# Patient Record
Sex: Female | Born: 1965 | Race: White | Hispanic: No | Marital: Married | Smoking: Never smoker
Health system: Southern US, Community
[De-identification: ages and names within clinical notes are randomized; demographics above are authoritative.]

## PROBLEM LIST (undated history)

## (undated) DIAGNOSIS — B019 Varicella without complication: Secondary | ICD-10-CM

## (undated) DIAGNOSIS — Z973 Presence of spectacles and contact lenses: Secondary | ICD-10-CM

## (undated) DIAGNOSIS — F32A Depression, unspecified: Secondary | ICD-10-CM

## (undated) HISTORY — PX: COSMETIC SURGERY: SHX468

## (undated) HISTORY — DX: Varicella without complication: B01.9

## (undated) HISTORY — DX: Depression, unspecified: F32.A

## (undated) HISTORY — PX: TUBAL LIGATION: SHX77

---

## 2005-02-17 ENCOUNTER — Inpatient Hospital Stay: Payer: Self-pay | Admitting: Obstetrics & Gynecology

## 2007-10-05 ENCOUNTER — Ambulatory Visit: Payer: Self-pay | Admitting: Obstetrics & Gynecology

## 2009-07-16 ENCOUNTER — Ambulatory Visit: Payer: Self-pay | Admitting: Obstetrics & Gynecology

## 2011-02-25 ENCOUNTER — Ambulatory Visit: Payer: Self-pay | Admitting: Obstetrics & Gynecology

## 2013-11-05 ENCOUNTER — Ambulatory Visit: Payer: Self-pay | Admitting: Obstetrics & Gynecology

## 2013-11-19 ENCOUNTER — Ambulatory Visit: Payer: Self-pay | Admitting: Obstetrics & Gynecology

## 2014-11-13 ENCOUNTER — Other Ambulatory Visit: Payer: Self-pay | Admitting: Obstetrics & Gynecology

## 2014-11-13 DIAGNOSIS — R922 Inconclusive mammogram: Secondary | ICD-10-CM

## 2014-11-13 DIAGNOSIS — Z1231 Encounter for screening mammogram for malignant neoplasm of breast: Secondary | ICD-10-CM

## 2014-11-25 ENCOUNTER — Ambulatory Visit
Admission: RE | Admit: 2014-11-25 | Discharge: 2014-11-25 | Disposition: A | Discharge status: Home / Self Care | Payer: 59 | Source: Ambulatory Visit | Attending: Obstetrics & Gynecology | Admitting: Obstetrics & Gynecology

## 2014-11-25 DIAGNOSIS — Z1231 Encounter for screening mammogram for malignant neoplasm of breast: Secondary | ICD-10-CM | POA: Diagnosis present

## 2014-11-25 DIAGNOSIS — R922 Inconclusive mammogram: Secondary | ICD-10-CM

## 2015-11-14 ENCOUNTER — Ambulatory Visit (INDEPENDENT_AMBULATORY_CARE_PROVIDER_SITE_OTHER): Payer: 59 | Admitting: Family Medicine

## 2015-11-14 ENCOUNTER — Encounter: Payer: Self-pay | Admitting: Family Medicine

## 2015-11-14 VITALS — BP 106/84 | HR 77 | Temp 98.5°F | Resp 14 | Ht 63.5 in | Wt 125.4 lb

## 2015-11-14 DIAGNOSIS — Z1231 Encounter for screening mammogram for malignant neoplasm of breast: Secondary | ICD-10-CM

## 2015-11-14 DIAGNOSIS — Z0000 Encounter for general adult medical examination without abnormal findings: Secondary | ICD-10-CM | POA: Diagnosis not present

## 2015-11-14 DIAGNOSIS — Z23 Encounter for immunization: Secondary | ICD-10-CM

## 2015-11-14 DIAGNOSIS — Z1239 Encounter for other screening for malignant neoplasm of breast: Secondary | ICD-10-CM | POA: Insufficient documentation

## 2015-11-14 LAB — COMPREHENSIVE METABOLIC PANEL
ALT: 9 U/L (ref 0–35)
AST: 18 U/L (ref 0–37)
Albumin: 4.5 g/dL (ref 3.5–5.2)
Alkaline Phosphatase: 50 U/L (ref 39–117)
BILIRUBIN TOTAL: 0.4 mg/dL (ref 0.2–1.2)
BUN: 12 mg/dL (ref 6–23)
CALCIUM: 10.1 mg/dL (ref 8.4–10.5)
CO2: 30 meq/L (ref 19–32)
CREATININE: 0.93 mg/dL (ref 0.40–1.20)
Chloride: 105 mEq/L (ref 96–112)
GFR: 68.03 mL/min (ref >60.00)
Glucose, Bld: 78 mg/dL (ref 70–99)
Potassium: 5.4 mEq/L — ABNORMAL HIGH (ref 3.5–5.1)
Sodium: 143 mEq/L (ref 135–145)
TOTAL PROTEIN: 7.4 g/dL (ref 6.0–8.3)

## 2015-11-14 LAB — LIPID PANEL
CHOL/HDL RATIO: 2
CHOLESTEROL: 208 mg/dL — AB (ref 0–200)
HDL: 95.4 mg/dL (ref >39.00)
LDL CALC: 103 mg/dL — AB (ref 0–99)
NonHDL: 112.9
Triglycerides: 49 mg/dL (ref 0.0–149.0)
VLDL: 9.8 mg/dL (ref 0.0–40.0)

## 2015-11-14 LAB — CBC
HCT: 41.1 % (ref 36.0–46.0)
HEMOGLOBIN: 13.8 g/dL (ref 12.0–15.0)
MCHC: 33.5 g/dL (ref 30.0–36.0)
MCV: 91.3 fl (ref 78.0–100.0)
PLATELETS: 285 10*3/uL (ref 150.0–400.0)
RBC: 4.5 Mil/uL (ref 3.87–5.11)
RDW: 12.7 % (ref 11.5–15.5)
WBC: 11.4 10*3/uL — AB (ref 4.0–10.5)

## 2015-11-14 LAB — TSH: TSH: 2.27 u[IU]/mL (ref 0.35–4.50)

## 2015-11-14 NOTE — Patient Instructions (Signed)
Follow up annually.  Take care  Dr. Lacinda Axon   Health Maintenance, Female Adopting a healthy lifestyle and getting preventive care can go a long way to promote health and wellness. Talk with your health care provider about what schedule of regular examinations is right for you. This is a good chance for you to check in with your provider about disease prevention and staying healthy. In between checkups, there are plenty of things you can do on your own. Experts have done a lot of research about which lifestyle changes and preventive measures are most likely to keep you healthy. Ask your health care provider for more information. WEIGHT AND DIET  Eat a healthy diet  Be sure to include plenty of vegetables, fruits, low-fat dairy products, and lean protein.  Do not eat a lot of foods high in solid fats, added sugars, or salt.  Get regular exercise. This is one of the most important things you can do for your health.  Most adults should exercise for at least 150 minutes each week. The exercise should increase your heart rate and make you sweat (moderate-intensity exercise).  Most adults should also do strengthening exercises at least twice a week. This is in addition to the moderate-intensity exercise.  Maintain a healthy weight  Body mass index (BMI) is a measurement that can be used to identify possible weight problems. It estimates body fat based on height and weight. Your health care provider can help determine your BMI and help you achieve or maintain a healthy weight.  For females 106 years of age and older:   A BMI below 18.5 is considered underweight.  A BMI of 18.5 to 24.9 is normal.  A BMI of 25 to 29.9 is considered overweight.  A BMI of 30 and above is considered obese.  Watch levels of cholesterol and blood lipids  You should start having your blood tested for lipids and cholesterol at 49 years of age, then have this test every 5 years.  You may need to have your  cholesterol levels checked more often if:  Your lipid or cholesterol levels are high.  You are older than 49 years of age.  You are at high risk for heart disease.  CANCER SCREENING   Lung Cancer  Lung cancer screening is recommended for adults 21-4 years old who are at high risk for lung cancer because of a history of smoking.  A yearly low-dose CT scan of the lungs is recommended for people who:  Currently smoke.  Have quit within the past 15 years.  Have at least a 30-pack-year history of smoking. A pack year is smoking an average of one pack of cigarettes a day for 1 year.  Yearly screening should continue until it has been 15 years since you quit.  Yearly screening should stop if you develop a health problem that would prevent you from having lung cancer treatment.  Breast Cancer  Practice breast self-awareness. This means understanding how your breasts normally appear and feel.  It also means doing regular breast self-exams. Let your health care provider know about any changes, no matter how small.  If you are in your 20s or 30s, you should have a clinical breast exam (CBE) by a health care provider every 1-3 years as part of a regular health exam.  If you are 34 or older, have a CBE every year. Also consider having a breast X-ray (mammogram) every year.  If you have a family history of breast cancer, talk  to your health care provider about genetic screening.  If you are at high risk for breast cancer, talk to your health care provider about having an MRI and a mammogram every year.  Breast cancer gene (BRCA) assessment is recommended for women who have family members with BRCA-related cancers. BRCA-related cancers include:  Breast.  Ovarian.  Tubal.  Peritoneal cancers.  Results of the assessment will determine the need for genetic counseling and BRCA1 and BRCA2 testing. Cervical Cancer Your health care provider may recommend that you be screened regularly  for cancer of the pelvic organs (ovaries, uterus, and vagina). This screening involves a pelvic examination, including checking for microscopic changes to the surface of your cervix (Pap test). You may be encouraged to have this screening done every 3 years, beginning at age 61.  For women ages 66-65, health care providers may recommend pelvic exams and Pap testing every 3 years, or they may recommend the Pap and pelvic exam, combined with testing for human papilloma virus (HPV), every 5 years. Some types of HPV increase your risk of cervical cancer. Testing for HPV may also be done on women of any age with unclear Pap test results.  Other health care providers may not recommend any screening for nonpregnant women who are considered low risk for pelvic cancer and who do not have symptoms. Ask your health care provider if a screening pelvic exam is right for you.  If you have had past treatment for cervical cancer or a condition that could lead to cancer, you need Pap tests and screening for cancer for at least 20 years after your treatment. If Pap tests have been discontinued, your risk factors (such as having a new sexual partner) need to be reassessed to determine if screening should resume. Some women have medical problems that increase the chance of getting cervical cancer. In these cases, your health care provider may recommend more frequent screening and Pap tests. Colorectal Cancer  This type of cancer can be detected and often prevented.  Routine colorectal cancer screening usually begins at 49 years of age and continues through 49 years of age.  Your health care provider may recommend screening at an earlier age if you have risk factors for colon cancer.  Your health care provider may also recommend using home test kits to check for hidden blood in the stool.  A small camera at the end of a tube can be used to examine your colon directly (sigmoidoscopy or colonoscopy). This is done to check  for the earliest forms of colorectal cancer.  Routine screening usually begins at age 109.  Direct examination of the colon should be repeated every 5-10 years through 49 years of age. However, you may need to be screened more often if early forms of precancerous polyps or small growths are found. Skin Cancer  Check your skin from head to toe regularly.  Tell your health care provider about any new moles or changes in moles, especially if there is a change in a mole's shape or color.  Also tell your health care provider if you have a mole that is larger than the size of a pencil eraser.  Always use sunscreen. Apply sunscreen liberally and repeatedly throughout the day.  Protect yourself by wearing long sleeves, pants, a wide-brimmed hat, and sunglasses whenever you are outside. HEART DISEASE, DIABETES, AND HIGH BLOOD PRESSURE   High blood pressure causes heart disease and increases the risk of stroke. High blood pressure is more likely to develop  in:  People who have blood pressure in the high end of the normal range (130-139/85-89 mm Hg).  People who are overweight or obese.  People who are African American.  If you are 80-31 years of age, have your blood pressure checked every 3-5 years. If you are 29 years of age or older, have your blood pressure checked every year. You should have your blood pressure measured twice--once when you are at a hospital or clinic, and once when you are not at a hospital or clinic. Record the average of the two measurements. To check your blood pressure when you are not at a hospital or clinic, you can use:  An automated blood pressure machine at a pharmacy.  A home blood pressure monitor.  If you are between 81 years and 53 years old, ask your health care provider if you should take aspirin to prevent strokes.  Have regular diabetes screenings. This involves taking a blood sample to check your fasting blood sugar level.  If you are at a normal  weight and have a low risk for diabetes, have this test once every three years after 49 years of age.  If you are overweight and have a high risk for diabetes, consider being tested at a younger age or more often. PREVENTING INFECTION  Hepatitis B  If you have a higher risk for hepatitis B, you should be screened for this virus. You are considered at high risk for hepatitis B if:  You were born in a country where hepatitis B is common. Ask your health care provider which countries are considered high risk.  Your parents were born in a high-risk country, and you have not been immunized against hepatitis B (hepatitis B vaccine).  You have HIV or AIDS.  You use needles to inject street drugs.  You live with someone who has hepatitis B.  You have had sex with someone who has hepatitis B.  You get hemodialysis treatment.  You take certain medicines for conditions, including cancer, organ transplantation, and autoimmune conditions. Hepatitis C  Blood testing is recommended for:  Everyone born from [birth year] through 1964.  Anyone with known risk factors for hepatitis C. Sexually transmitted infections (STIs)  You should be screened for sexually transmitted infections (STIs) including gonorrhea and chlamydia if:  You are sexually active and are younger than 49 years of age.  You are older than 49 years of age and your health care provider tells you that you are at risk for this type of infection.  Your sexual activity has changed since you were last screened and you are at an increased risk for chlamydia or gonorrhea. Ask your health care provider if you are at risk.  If you do not have HIV, but are at risk, it may be recommended that you take a prescription medicine daily to prevent HIV infection. This is called pre-exposure prophylaxis (PrEP). You are considered at risk if:  You are sexually active and do not regularly use condoms or know the HIV status of your partner(s).  You take  drugs by injection.  You are sexually active with a partner who has HIV. Talk with your health care provider about whether you are at high risk of being infected with HIV. If you choose to begin PrEP, you should first be tested for HIV. You should then be tested every 3 months for as long as you are taking PrEP.  PREGNANCY   If you are premenopausal and you may become pregnant, ask  your health care provider about preconception counseling.  If you may become pregnant, take 400 to 800 micrograms (mcg) of folic acid every day.  If you want to prevent pregnancy, talk to your health care provider about birth control (contraception). OSTEOPOROSIS AND MENOPAUSE   Osteoporosis is a disease in which the bones lose minerals and strength with aging. This can result in serious bone fractures. Your risk for osteoporosis can be identified using a bone density scan.  If you are 35 years of age or older, or if you are at risk for osteoporosis and fractures, ask your health care provider if you should be screened.  Ask your health care provider whether you should take a calcium or vitamin D supplement to lower your risk for osteoporosis.  Menopause may have certain physical symptoms and risks.  Hormone replacement therapy may reduce some of these symptoms and risks. Talk to your health care provider about whether hormone replacement therapy is right for you.  HOME CARE INSTRUCTIONS   Schedule regular health, dental, and eye exams.  Stay current with your immunizations.   Do not use any tobacco products including cigarettes, chewing tobacco, or electronic cigarettes.  If you are pregnant, do not drink alcohol.  If you are breastfeeding, limit how much and how often you drink alcohol.  Limit alcohol intake to no more than 1 drink per day for nonpregnant women. One drink equals 12 ounces of beer, 5 ounces of wine, or 1 ounces of hard liquor.  Do not use street drugs.  Do not share  needles.  Ask your health care provider for help if you need support or information about quitting drugs.  Tell your health care provider if you often feel depressed.  Tell your health care provider if you have ever been abused or do not feel safe at home.   This information is not intended to replace advice given to you by your health care provider. Make sure you discuss any questions you have with your health care provider.   Document Released: 07/06/2010 Document Revised: 01/11/2014 Document Reviewed: 11/22/2012 Elsevier Interactive Patient Education [employer].

## 2015-11-14 NOTE — Progress Notes (Signed)
Subjective:  Patient ID: Carmen George, female    DOB: 12/20/1965  Age: 50 y.o. MRN: [id number]  CC: Establish care/physical  HPI Carmen LarsenLillian M Munns is a 50 y.o. female presents to the clinic today for an annual physical and to establish care.  Preventative Healthcare  Pap smear: Up to date.  Mammogram: In need of.  Colonoscopy: Not indicated.  Immunizations  Tetanus - Has been approx. 10 years.  Pneumococcal - Not indicated.  Flu - In need of.  Labs: Screening labs today.  Exercise: Exercises regularly.  Alcohol use: See below.  Smoking/tobacco use: Nonsmoker.  Regular dental exams: Yes.   Wears seat belt: Yes.  PMH, Surgical Hx, Family Hx, Social History reviewed and updated as below.  Past Medical History:  Diagnosis Date  . Chicken pox   . Chicken pox    Past Surgical History:  Procedure Laterality Date  . CESAREAN SECTION     x 3   Family History  Problem Relation Age of Onset  . Hyperlipidemia Mother   . Hypertension Mother   . Heart disease Father   . Hypertension Father   . Mental illness Father   . Diabetes Father   . Breast cancer Neg Hx    Social History  Substance Use Topics  . Smoking status: Never Smoker  . Smokeless tobacco: Not on file  . Alcohol use Yes     Comment: 3-4/week    Review of Systems  Cardiovascular: Positive for chest pain.  Psychiatric/Behavioral:       Stress.  All other systems reviewed and are negative.  Objective:   Today's Vitals: BP 106/84   Pulse 77   Temp 98.5 F (36.9 C)   Resp 14   Ht 5' 3.5" (1.613 m)   Wt 125 lb 6.4 oz (56.9 kg)   SpO2 98%   BMI 21.87 kg/m   Physical Exam  Constitutional: She is oriented to person, place, and time. She appears well-developed and well-nourished. No distress.  HENT:  Head: Normocephalic and atraumatic.  Nose: Nose normal.  Mouth/Throat: Oropharynx is clear and moist. No oropharyngeal exudate.  Normal TM's bilaterally.   Eyes: Conjunctivae are  normal. No scleral icterus.  Neck: Neck supple.  Cardiovascular: Normal rate and regular rhythm.   No murmur heard. Pulmonary/Chest: Effort normal and breath sounds normal. She has no wheezes. She has no rales.  Abdominal: Soft. She exhibits no distension. There is no tenderness. There is no rebound and no guarding.  Musculoskeletal: Normal range of motion. She exhibits no edema.  Lymphadenopathy:    She has no cervical adenopathy.  Neurological: She is alert and oriented to person, place, and time.  Skin: Skin is warm and dry. No rash noted.  Psychiatric: She has a normal mood and affect.  Vitals reviewed.  Assessment & Plan:   Problem List Items Addressed This Visit    Breast cancer screening   Relevant Orders   MM Digital Screening   Annual physical exam - Primary    Flu given today. Annual labs. Pap smear up to date. Mammogram ordered. Patient is a healthy weight. Blood pressure normal. Exercising regularly.      Relevant Orders   CBC   Comprehensive metabolic panel   Lipid panel   TSH    Other Visit Diagnoses    Encounter for immunization       Relevant Orders   Flu Vaccine QUAD 36+ mos IM (Completed)     Follow-up: Return in about 1  year (around 11/13/2016).  Everlene OtherJayce Bartlomiej Jenkinson DO [facility] [place]

## 2015-11-14 NOTE — Progress Notes (Signed)
Pre visit review using our clinic review tool, if applicable. No additional management support is needed unless otherwise documented below in the visit note. 

## 2015-11-14 NOTE — Assessment & Plan Note (Signed)
Flu given today. Annual labs. Pap smear up to date. Mammogram ordered. Patient is a healthy weight. Blood pressure normal. Exercising regularly.

## 2015-11-18 ENCOUNTER — Telehealth: Payer: Self-pay | Admitting: Family Medicine

## 2015-11-18 NOTE — Telephone Encounter (Signed)
Pt called returning your call in regards to lab results. Thank you!  Call pt @ [phone]

## 2015-11-18 NOTE — Telephone Encounter (Signed)
Pt advised of labs and gave verbal understanding.

## 2015-12-10 ENCOUNTER — Ambulatory Visit: Admission: RE | Admit: 2015-12-10 | Payer: 59 | Source: Ambulatory Visit

## 2015-12-12 ENCOUNTER — Ambulatory Visit
Admission: RE | Admit: 2015-12-12 | Discharge: 2015-12-12 | Disposition: A | Discharge status: Home / Self Care | Payer: 59 | Source: Ambulatory Visit | Attending: Family Medicine | Admitting: Family Medicine

## 2015-12-12 DIAGNOSIS — Z1231 Encounter for screening mammogram for malignant neoplasm of breast: Secondary | ICD-10-CM | POA: Diagnosis not present

## 2015-12-12 DIAGNOSIS — Z1239 Encounter for other screening for malignant neoplasm of breast: Secondary | ICD-10-CM

## 2016-08-18 ENCOUNTER — Ambulatory Visit: Payer: Self-pay | Admitting: Obstetrics & Gynecology

## 2016-09-23 ENCOUNTER — Encounter: Payer: Self-pay | Admitting: Obstetrics & Gynecology

## 2016-09-23 ENCOUNTER — Ambulatory Visit (INDEPENDENT_AMBULATORY_CARE_PROVIDER_SITE_OTHER): Payer: 59 | Admitting: Obstetrics & Gynecology

## 2016-09-23 VITALS — BP 120/80 | HR 69 | Ht 64.0 in | Wt 128.0 lb

## 2016-09-23 DIAGNOSIS — Z13220 Encounter for screening for lipoid disorders: Secondary | ICD-10-CM

## 2016-09-23 DIAGNOSIS — Z1211 Encounter for screening for malignant neoplasm of colon: Secondary | ICD-10-CM

## 2016-09-23 DIAGNOSIS — Z1329 Encounter for screening for other suspected endocrine disorder: Secondary | ICD-10-CM

## 2016-09-23 DIAGNOSIS — Z1239 Encounter for other screening for malignant neoplasm of breast: Secondary | ICD-10-CM

## 2016-09-23 DIAGNOSIS — Z1321 Encounter for screening for nutritional disorder: Secondary | ICD-10-CM | POA: Diagnosis not present

## 2016-09-23 DIAGNOSIS — Z0000 Encounter for general adult medical examination without abnormal findings: Secondary | ICD-10-CM

## 2016-09-23 DIAGNOSIS — Z1231 Encounter for screening mammogram for malignant neoplasm of breast: Secondary | ICD-10-CM | POA: Diagnosis not present

## 2016-09-23 DIAGNOSIS — Z124 Encounter for screening for malignant neoplasm of cervix: Secondary | ICD-10-CM | POA: Diagnosis not present

## 2016-09-23 NOTE — Patient Instructions (Signed)
PAP every three years Mammogram every year    Call [phone] to schedule at [facility] Colonoscopy every 10 years 

## 2016-09-23 NOTE — Progress Notes (Signed)
HPI:      [name] is a 51 y.o. [id number] who LMP was in the past, she presents today for her annual examination.  The patient has no complaints today. The patient is sexually active. Herlast pap: approximate date 2015 and was normal and last mammogram: approximate date 2016 and was normal.  The patient does perform self breast exams.  There is no notable family history of breast or ovarian cancer in her family. The patient is not taking hormone replacement therapy. Patient denies post-menopausal vaginal bleeding.   The patient has regular exercise: yes. The patient denies current symptoms of depression.  No period >1 year  GYN Hx: Last Colonoscopy:never ago.  Last DEXA: never ago.    PMHx: Past Medical History:  Diagnosis Date  . Chicken pox   . Chicken pox    Past Surgical History:  Procedure Laterality Date  . CESAREAN SECTION     x 3   Family History  Problem Relation Age of Onset  . Hyperlipidemia Mother   . Hypertension Mother   . Heart disease Father   . Hypertension Father   . Mental illness Father   . Diabetes Father   . Breast cancer Neg Hx    Social History  Substance Use Topics  . Smoking status: Never Smoker  . Smokeless tobacco: Never Used  . Alcohol use Yes     Comment: 3-4/week   No current outpatient prescriptions on file. Allergies: Patient has no known allergies.  Review of Systems  Constitutional: Negative for chills, fever and malaise/fatigue.  HENT: Negative for congestion, sinus pain and sore throat.   Eyes: Negative for blurred vision and pain.  Respiratory: Negative for cough and wheezing.   Cardiovascular: Negative for chest pain and leg swelling.  Gastrointestinal: Negative for abdominal pain, constipation, diarrhea, heartburn, nausea and vomiting.  Genitourinary: Negative for dysuria, frequency, hematuria and urgency.  Musculoskeletal: Negative for back pain, joint pain, myalgias and neck pain.  Skin: Negative for itching and  rash.  Neurological: Negative for dizziness, tremors and weakness.  Endo/Heme/Allergies: Does not bruise/bleed easily.  Psychiatric/Behavioral: Negative for depression. The patient is not nervous/anxious and does not have insomnia.     Objective: BP 120/80   Pulse 69   Ht  (1.626 m)   Wt 128 lb (58.1 kg)   LMP 06/06/2015   BMI 21.97 kg/m   Filed Weights   09/23/16 0946  Weight: 128 lb (58.1 kg)   Body mass index is 21.97 kg/m. Physical Exam  Constitutional: She is oriented to person, place, and time. She appears well-developed and well-nourished. No distress.  Genitourinary: Rectum normal, vagina normal and uterus normal. Pelvic exam was performed with patient supine. There is no rash or lesion on the right labia. There is no rash or lesion on the left labia. Vagina exhibits no lesion. No bleeding in the vagina. Right adnexum does not display mass and does not display tenderness. Left adnexum does not display mass and does not display tenderness. Cervix does not exhibit motion tenderness, lesion, friability or polyp.   Uterus is mobile and midaxial. Uterus is not enlarged or exhibiting a mass.  HENT:  Head: Normocephalic and atraumatic. Head is without laceration.  Right Ear: Hearing normal.  Left Ear: Hearing normal.  Nose: No epistaxis.  No foreign bodies.  Mouth/Throat: Uvula is midline, oropharynx is clear and moist and mucous membranes are normal.  Eyes: Pupils are equal, round, and reactive to light.  Neck: Normal range  of motion. Neck supple. No thyromegaly present.  Cardiovascular: Normal rate and regular rhythm.  Exam reveals no gallop and no friction rub.   No murmur heard. Pulmonary/Chest: Effort normal and breath sounds normal. No respiratory distress. She has no wheezes. Right breast exhibits no mass, no skin change and no tenderness. Left breast exhibits no mass, no skin change and no tenderness.  Abdominal: Soft. Bowel sounds are normal. She exhibits no  distension. There is no tenderness. There is no rebound.  Musculoskeletal: Normal range of motion.  Neurological: She is alert and oriented to person, place, and time. No cranial nerve deficit.  Skin: Skin is warm and dry.  Psychiatric: She has a normal mood and affect. Judgment normal.  Vitals reviewed.   Assessment: Annual Exam 1. Annual physical exam   2. Screening for breast cancer   3. Screening for cervical cancer   4. Screen for colon cancer   5. Encounter for vitamin deficiency screening   6. Screening for cholesterol level   7. Screening for thyroid disorder     Plan:            1.  Cervical Screening-  Pap smear done today  2. Breast screening- Exam annually and mammogram scheduled  3. Colonoscopy every 10 years, Hemoccult testing after age 58  4. Labs Ordered today  5. Counseling for hormonal therapy: none     F/U  Return in about 1 year (around 09/23/2017) for Annual.  Annamarie Major, MD, [facility], [facility] 09/23/2016  10:10 AM

## 2016-09-24 LAB — LIPID PANEL
CHOLESTEROL TOTAL: 208 mg/dL — AB (ref 100–199)
Chol/HDL Ratio: 2 ratio (ref 0.0–4.4)
HDL: 105 mg/dL (ref >39)
LDL CALC: 91 mg/dL (ref 0–99)
TRIGLYCERIDES: 59 mg/dL (ref 0–149)
VLDL CHOLESTEROL CAL: 12 mg/dL (ref 5–40)

## 2016-09-24 LAB — VITAMIN D 25 HYDROXY (VIT D DEFICIENCY, FRACTURES): VIT D 25 HYDROXY: 33.9 ng/mL (ref 30.0–100.0)

## 2016-09-24 LAB — TSH: TSH: 1.51 u[IU]/mL (ref 0.450–4.500)

## 2016-09-28 LAB — IGP, APTIMA HPV
HPV Aptima: NEGATIVE
PAP Smear Comment: 0

## 2016-12-17 ENCOUNTER — Encounter: Payer: Self-pay | Admitting: *Deleted

## 2016-12-20 ENCOUNTER — Ambulatory Visit
Admission: RE | Admit: 2016-12-20 | Discharge: 2016-12-20 | Disposition: A | Discharge status: Home / Self Care | Payer: 59 | Source: Ambulatory Visit | Attending: Obstetrics & Gynecology | Admitting: Obstetrics & Gynecology

## 2016-12-20 DIAGNOSIS — Z1239 Encounter for other screening for malignant neoplasm of breast: Secondary | ICD-10-CM

## 2016-12-20 DIAGNOSIS — Z1231 Encounter for screening mammogram for malignant neoplasm of breast: Secondary | ICD-10-CM | POA: Insufficient documentation

## 2016-12-21 ENCOUNTER — Other Ambulatory Visit: Payer: Self-pay | Admitting: Obstetrics & Gynecology

## 2016-12-21 DIAGNOSIS — R928 Other abnormal and inconclusive findings on diagnostic imaging of breast: Secondary | ICD-10-CM

## 2017-01-03 ENCOUNTER — Ambulatory Visit
Admission: RE | Admit: 2017-01-03 | Discharge: 2017-01-03 | Disposition: A | Discharge status: Home / Self Care | Payer: 59 | Source: Ambulatory Visit | Attending: Obstetrics & Gynecology | Admitting: Obstetrics & Gynecology

## 2017-01-03 ENCOUNTER — Encounter: Payer: Self-pay | Admitting: Radiology

## 2017-01-03 DIAGNOSIS — R590 Localized enlarged lymph nodes: Secondary | ICD-10-CM | POA: Diagnosis not present

## 2017-01-03 DIAGNOSIS — R928 Other abnormal and inconclusive findings on diagnostic imaging of breast: Secondary | ICD-10-CM | POA: Insufficient documentation

## 2018-01-20 ENCOUNTER — Ambulatory Visit: Payer: 59 | Admitting: Obstetrics & Gynecology

## 2018-02-03 ENCOUNTER — Encounter: Payer: Self-pay | Admitting: Obstetrics & Gynecology

## 2018-02-03 ENCOUNTER — Other Ambulatory Visit: Payer: Self-pay

## 2018-02-03 ENCOUNTER — Telehealth: Payer: Self-pay | Admitting: Gastroenterology

## 2018-02-03 ENCOUNTER — Ambulatory Visit (INDEPENDENT_AMBULATORY_CARE_PROVIDER_SITE_OTHER): Payer: 59 | Admitting: Obstetrics & Gynecology

## 2018-02-03 VITALS — BP 100/60 | Ht 64.0 in | Wt 129.0 lb

## 2018-02-03 DIAGNOSIS — Z01419 Encounter for gynecological examination (general) (routine) without abnormal findings: Secondary | ICD-10-CM | POA: Diagnosis not present

## 2018-02-03 DIAGNOSIS — Z1211 Encounter for screening for malignant neoplasm of colon: Secondary | ICD-10-CM

## 2018-02-03 DIAGNOSIS — Z1239 Encounter for other screening for malignant neoplasm of breast: Secondary | ICD-10-CM

## 2018-02-03 NOTE — Progress Notes (Signed)
HPI:      [name] is a 53 y.o. [id number] who LMP was in the past- she still has about one period a year; she presents today for her annual examination.  The patient has no complaints today.  Insomnia and hot flashes at times, more associated w stress.  The patient is sexually active. Herlast pap: approximate date 2018 and was normal and last mammogram: approximate date 2018 (dec) and was normal.  The patient does perform self breast exams.  There is no notable family history of breast or ovarian cancer in her family. The patient is not taking hormone replacement therapy. Patient denies post-menopausal vaginal bleeding.   The patient has regular exercise: yes. The patient denies current symptoms of depression.    GYN Hx: Last Colonoscopy: Has not done yet.  PMHx: Past Medical History:  Diagnosis Date  . Chicken pox   . Chicken pox    Past Surgical History:  Procedure Laterality Date  . CESAREAN SECTION     x 3   Family History  Problem Relation Age of Onset  . Hyperlipidemia Mother   . Hypertension Mother   . Heart disease Father   . Hypertension Father   . Mental illness Father   . Diabetes Father   . Breast cancer Neg Hx    Social History   Tobacco Use  . Smoking status: Never Smoker  . Smokeless tobacco: Never Used  Substance Use Topics  . Alcohol use: Yes    Comment: 3-4/week  . Drug use: No   No current outpatient medications on file. Allergies: Patient has no known allergies.  Review of Systems  Constitutional: Negative for chills, fever and malaise/fatigue.  HENT: Negative for congestion, sinus pain and sore throat.   Eyes: Negative for blurred vision and pain.  Respiratory: Negative for cough and wheezing.   Cardiovascular: Negative for chest pain and leg swelling.  Gastrointestinal: Negative for abdominal pain, constipation, diarrhea, heartburn, nausea and vomiting.  Genitourinary: Negative for dysuria, frequency, hematuria and urgency.    Musculoskeletal: Negative for back pain, joint pain, myalgias and neck pain.  Skin: Negative for itching and rash.  Neurological: Negative for dizziness, tremors and weakness.  Endo/Heme/Allergies: Does not bruise/bleed easily.  Psychiatric/Behavioral: Negative for depression. The patient is not nervous/anxious and does not have insomnia.     Objective: BP 100/60   Ht 5\' 4"  (1.626 m)   Wt 129 lb (58.5 kg)   BMI 22.14 kg/m   Filed Weights   02/03/18 0822  Weight: 129 lb (58.5 kg)   Body mass index is 22.14 kg/m. Physical Exam Constitutional:      General: She is not in acute distress.    Appearance: She is well-developed.  Genitourinary:     Pelvic exam was performed with patient supine.     Vagina, uterus and rectum normal.     No lesions in the vagina.     No vaginal bleeding.     No cervical motion tenderness, friability, lesion or polyp.     Uterus is mobile.     Uterus is not enlarged.     No uterine mass detected.    Uterus is midaxial.     No right or left adnexal mass present.     Right adnexa not tender.     Left adnexa not tender.  HENT:     Head: Normocephalic and atraumatic. No laceration.     Right Ear: Hearing normal.     Left Ear:  Hearing normal.     Mouth/Throat:     Pharynx: Uvula midline.  Eyes:     Pupils: Pupils are equal, round, and reactive to light.  Neck:     Musculoskeletal: Normal range of motion and neck supple.     Thyroid: No thyromegaly.  Cardiovascular:     Rate and Rhythm: Normal rate and regular rhythm.     Heart sounds: No murmur. No friction rub. No gallop.   Pulmonary:     Effort: Pulmonary effort is normal. No respiratory distress.     Breath sounds: Normal breath sounds. No wheezing.  Chest:     Breasts:        Right: No mass, skin change or tenderness.        Left: No mass, skin change or tenderness.  Abdominal:     General: Bowel sounds are normal. There is no distension.     Palpations: Abdomen is soft.      Tenderness: There is no abdominal tenderness. There is no rebound.  Musculoskeletal: Normal range of motion.  Neurological:     Mental Status: She is alert and oriented to person, place, and time.     Cranial Nerves: No cranial nerve deficit.  Skin:    General: Skin is warm and dry.  Psychiatric:        Judgment: Judgment normal.  Vitals signs reviewed.   Assessment: Annual Exam 1. Women's annual routine gynecological examination   2. Screening for breast cancer   3. Screen for colon cancer    Plan:            1.  Cervical Screening-  Pap smear schedule reviewed with patient  2. Breast screening- Exam annually and mammogram scheduled  3. Colonoscopy every 10 years, Hemoccult testing after age 73  4. Labs UTD; normal 2018  5. Counseling for hormonal therapy: none Monitor menopause sx's, no need for tx now  6. Stress, monitor effects.  Mother healthy, mother in law lives nearby and is having some health decline.  Has senior in [employer], planning college.  Work involves travel monthly, low stress there usually.    F/U  Return in about 1 year (around 02/04/2019) for Annual.  Annamarie Major, MD, [facility], [facility] 02/03/2018  8:57 AM

## 2018-02-03 NOTE — Patient Instructions (Signed)
PAP every three years Mammogram every year    Call [phone] to schedule at [place] Colonoscopy every 10 years Labs yearly (with PCP) 

## 2018-02-03 NOTE — Telephone Encounter (Signed)
Pt is calling for Marcelino Duster to finish setting up for a colonscopy

## 2018-02-03 NOTE — Telephone Encounter (Signed)
Patient returned call to complete scheduling her colonoscopy.  Scheduling has been completed.  She has been scheduled for 03/31/18 at [facility] with Dr. Servando Snare.  Thanks [employer]

## 2018-03-07 ENCOUNTER — Ambulatory Visit
Admission: RE | Admit: 2018-03-07 | Discharge: 2018-03-07 | Disposition: A | Discharge status: Home / Self Care | Payer: 59 | Source: Ambulatory Visit | Attending: Obstetrics & Gynecology | Admitting: Obstetrics & Gynecology

## 2018-03-07 DIAGNOSIS — Z1239 Encounter for other screening for malignant neoplasm of breast: Secondary | ICD-10-CM | POA: Diagnosis present

## 2018-03-07 DIAGNOSIS — Z1231 Encounter for screening mammogram for malignant neoplasm of breast: Secondary | ICD-10-CM | POA: Insufficient documentation

## 2018-03-27 ENCOUNTER — Telehealth: Payer: Self-pay

## 2018-03-27 NOTE — Telephone Encounter (Signed)
LVM informing patient her 03/31/18 colonoscopy has been canceled due to COVID19 and we will reschedule her once restrictions have been lifted.  Thanks [employer]

## 2018-03-31 ENCOUNTER — Ambulatory Visit: Admit: 2018-03-31 | Payer: 59 | Admitting: Gastroenterology

## 2018-03-31 SURGERY — COLONOSCOPY WITH PROPOFOL
Anesthesia: Choice

## 2018-04-11 ENCOUNTER — Telehealth: Payer: Self-pay

## 2018-04-11 NOTE — Telephone Encounter (Signed)
LVM for pt to call office in regards to rescheduling her screening colonoscopy at [facility] with Dr. Servando Snare.  Thanks [employer]

## 2018-11-21 ENCOUNTER — Telehealth: Payer: Self-pay | Admitting: Gastroenterology

## 2018-11-21 NOTE — Telephone Encounter (Signed)
Pt left vm to r/s her procedure °

## 2018-11-21 NOTE — Telephone Encounter (Signed)
LVM for pt to call office back to schedule her colonoscopy.  Thanks Presley Gora  

## 2019-01-31 ENCOUNTER — Other Ambulatory Visit: Payer: Self-pay | Admitting: Obstetrics & Gynecology

## 2019-01-31 DIAGNOSIS — Z1231 Encounter for screening mammogram for malignant neoplasm of breast: Secondary | ICD-10-CM

## 2019-03-08 ENCOUNTER — Ambulatory Visit
Admission: RE | Admit: 2019-03-08 | Discharge: 2019-03-08 | Disposition: A | Discharge status: Home / Self Care | Payer: 59 | Source: Ambulatory Visit | Attending: Obstetrics & Gynecology | Admitting: Obstetrics & Gynecology

## 2019-03-08 DIAGNOSIS — Z1231 Encounter for screening mammogram for malignant neoplasm of breast: Secondary | ICD-10-CM

## 2019-03-13 ENCOUNTER — Telehealth: Payer: Self-pay

## 2019-03-13 NOTE — Telephone Encounter (Signed)
Patient contacted the office in regards to scheduling her colonoscopy from March 2020.  Returned her phone call.  LVM for her to have her PCP or Dr. Tiburcio Pea to send a new referral because I'm unable to open the one from March 2020.  Thanks,  [place], [place]

## 2019-04-13 ENCOUNTER — Other Ambulatory Visit: Payer: Self-pay

## 2019-04-13 ENCOUNTER — Encounter: Payer: Self-pay | Admitting: Obstetrics & Gynecology

## 2019-04-13 ENCOUNTER — Other Ambulatory Visit (HOSPITAL_COMMUNITY)
Admission: RE | Admit: 2019-04-13 | Discharge: 2019-04-13 | Disposition: A | Discharge status: Home / Self Care | Payer: 59 | Source: Ambulatory Visit | Attending: Obstetrics & Gynecology | Admitting: Obstetrics & Gynecology

## 2019-04-13 ENCOUNTER — Ambulatory Visit (INDEPENDENT_AMBULATORY_CARE_PROVIDER_SITE_OTHER): Payer: 59 | Admitting: Obstetrics & Gynecology

## 2019-04-13 VITALS — BP 100/60 | Ht 64.0 in | Wt 130.0 lb

## 2019-04-13 DIAGNOSIS — Z124 Encounter for screening for malignant neoplasm of cervix: Secondary | ICD-10-CM

## 2019-04-13 DIAGNOSIS — Z01419 Encounter for gynecological examination (general) (routine) without abnormal findings: Secondary | ICD-10-CM | POA: Diagnosis not present

## 2019-04-13 DIAGNOSIS — Z1329 Encounter for screening for other suspected endocrine disorder: Secondary | ICD-10-CM

## 2019-04-13 DIAGNOSIS — Z1211 Encounter for screening for malignant neoplasm of colon: Secondary | ICD-10-CM

## 2019-04-13 DIAGNOSIS — Z13220 Encounter for screening for lipoid disorders: Secondary | ICD-10-CM

## 2019-04-13 DIAGNOSIS — Z131 Encounter for screening for diabetes mellitus: Secondary | ICD-10-CM

## 2019-04-13 DIAGNOSIS — Z1321 Encounter for screening for nutritional disorder: Secondary | ICD-10-CM

## 2019-04-13 NOTE — Addendum Note (Signed)
Addended by: Nadara Mustard on: 04/13/2019 08:49 AM   Modules accepted: Orders

## 2019-04-13 NOTE — Patient Instructions (Signed)
PAP every three years Mammogram every year    Call [phone] to schedule at [facility] Colonoscopy every 10 years Labs due

## 2019-04-13 NOTE — Progress Notes (Signed)
HPI:      [name] is a 54 y.o. [id number] who LMP was in the past, she presents today for her annual examination.  The patient has no complaints today. The patient is sexually active. Herlast pap: approximate date 2018 and was normal and last mammogram: approximate date 03/2019 and was normal.  The patient does perform self breast exams.  There is no notable family history of breast or ovarian cancer in her family. The patient is not taking hormone replacement therapy. Patient denies post-menopausal vaginal bleeding.   The patient has regular exercise: yes. The patient denies current symptoms of depression.  No bleeding for > 1 year, rare hot flash w stress.  GYN Hx: Last Colonoscopy: due last year, rescheduled due to Covid19, plans soon  PMHx: Past Medical History:  Diagnosis Date  . Chicken pox   . Chicken pox    Past Surgical History:  Procedure Laterality Date  . CESAREAN SECTION     x 3   Family History  Problem Relation Age of Onset  . Hyperlipidemia Mother   . Hypertension Mother   . Heart disease Father   . Hypertension Father   . Mental illness Father   . Diabetes Father   . Breast cancer Neg Hx    Social History   Tobacco Use  . Smoking status: Never Smoker  . Smokeless tobacco: Never Used  Substance Use Topics  . Alcohol use: Yes    Comment: 3-4/week  . Drug use: No   No current outpatient medications on file. Allergies: Patient has no known allergies.  Review of Systems  Constitutional: Negative for chills, fever and malaise/fatigue.  HENT: Negative for congestion, sinus pain and sore throat.   Eyes: Negative for blurred vision and pain.  Respiratory: Negative for cough and wheezing.   Cardiovascular: Negative for chest pain and leg swelling.  Gastrointestinal: Negative for abdominal pain, constipation, diarrhea, heartburn, nausea and vomiting.  Genitourinary: Negative for dysuria, frequency, hematuria and urgency.  Musculoskeletal: Negative for  back pain, joint pain, myalgias and neck pain.  Skin: Negative for itching and rash.  Neurological: Negative for dizziness, tremors and weakness.  Endo/Heme/Allergies: Does not bruise/bleed easily.  Psychiatric/Behavioral: Negative for depression. The patient is not nervous/anxious and does not have insomnia.     Objective: BP 100/60   Ht 5\' 4"  (1.626 m)   Wt 130 lb (59 kg)   BMI 22.31 kg/m   Filed Weights   04/13/19 0810  Weight: 130 lb (59 kg)   Body mass index is 22.31 kg/m. Physical Exam Constitutional:      General: She is not in acute distress.    Appearance: She is well-developed.  Genitourinary:     Pelvic exam was performed with patient supine.     Vagina, uterus and rectum normal.     No lesions in the vagina.     No vaginal bleeding.     No cervical motion tenderness, friability, lesion or polyp.     Uterus is mobile.     Uterus is not enlarged.     No uterine mass detected.    Uterus is midaxial.     No right or left adnexal mass present.     Right adnexa not tender.     Left adnexa not tender.  HENT:     Head: Normocephalic and atraumatic. No laceration.     Right Ear: Hearing normal.     Left Ear: Hearing normal.     Mouth/Throat:  Pharynx: Uvula midline.  Eyes:     Pupils: Pupils are equal, round, and reactive to light.  Neck:     Thyroid: No thyromegaly.  Cardiovascular:     Rate and Rhythm: Normal rate and regular rhythm.     Heart sounds: No murmur. No friction rub. No gallop.   Pulmonary:     Effort: Pulmonary effort is normal. No respiratory distress.     Breath sounds: Normal breath sounds. No wheezing.  Chest:     Breasts:        Right: No mass, skin change or tenderness.        Left: No mass, skin change or tenderness.  Abdominal:     General: Bowel sounds are normal. There is no distension.     Palpations: Abdomen is soft.     Tenderness: There is no abdominal tenderness. There is no rebound.  Musculoskeletal:        General:  Normal range of motion.     Cervical back: Normal range of motion and neck supple.  Neurological:     Mental Status: She is alert and oriented to person, place, and time.     Cranial Nerves: No cranial nerve deficit.  Skin:    General: Skin is warm and dry.  Psychiatric:        Judgment: Judgment normal.  Vitals reviewed.     Assessment: Annual Exam 1. Women's annual routine gynecological examination   2. Screen for colon cancer   3. Screening for cervical cancer     Plan:            1.  Cervical Screening-  Pap smear done today  2. Breast screening- Exam annually and mammogram scheduled  3. Colonoscopy every 10 years, Hemoccult testing after age 70  4. Labs Ordered today  5. Counseling for hormonal therapy: none              6. FRAX - FRAX score for assessing the 10 year probability for fracture calculated and discussed today.  Based on age and score today, DEXA is not currently scheduled.    F/U  Return in about 1 year (around 04/12/2020) for Annual.  Annamarie Major, MD, [facility], [facility] 04/13/2019  8:13 AM

## 2019-04-14 LAB — LIPID PANEL
Chol/HDL Ratio: 2.3 ratio (ref 0.0–4.4)
Cholesterol, Total: 208 mg/dL — ABNORMAL HIGH (ref 100–199)
HDL: 90 mg/dL (ref >39)
LDL Chol Calc (NIH): 108 mg/dL — ABNORMAL HIGH (ref 0–99)
Triglycerides: 54 mg/dL (ref 0–149)
VLDL Cholesterol Cal: 10 mg/dL (ref 5–40)

## 2019-04-14 LAB — VITAMIN D 25 HYDROXY (VIT D DEFICIENCY, FRACTURES): Vit D, 25-Hydroxy: 33.2 ng/mL (ref 30.0–100.0)

## 2019-04-14 LAB — GLUCOSE, FASTING: Glucose, Plasma: 83 mg/dL (ref 65–99)

## 2019-04-14 LAB — TSH: TSH: 2.21 u[IU]/mL (ref 0.450–4.500)

## 2019-04-16 LAB — CYTOLOGY - PAP
Comment: NEGATIVE
Diagnosis: NEGATIVE
High risk HPV: NEGATIVE

## 2019-04-20 ENCOUNTER — Ambulatory Visit (INDEPENDENT_AMBULATORY_CARE_PROVIDER_SITE_OTHER): Payer: Self-pay | Admitting: Gastroenterology

## 2019-04-20 ENCOUNTER — Other Ambulatory Visit: Payer: Self-pay

## 2019-04-20 DIAGNOSIS — Z1211 Encounter for screening for malignant neoplasm of colon: Secondary | ICD-10-CM

## 2019-04-20 NOTE — Progress Notes (Signed)
Gastroenterology Pre-Procedure Review  Request Date: 06/08/19 Requesting Physician: Dr. Servando Snare  PATIENT REVIEW QUESTIONS: The patient responded to the following health history questions as indicated:    1. Are you having any GI issues? no 2. Do you have a personal history of Polyps? no 3. Do you have a family history of Colon Cancer or Polyps? yes (brother colon polyps) 4. Diabetes Mellitus? no 5. Joint replacements in the past 12 months?no 6. Major health problems in the past 3 months?no 7. Any artificial heart valves, MVP, or defibrillator?no    MEDICATIONS & ALLERGIES:    Patient reports the following regarding taking any anticoagulation/antiplatelet therapy:   Plavix, Coumadin, Eliquis, Xarelto, Lovenox, Pradaxa, Brilinta, or Effient? no Aspirin? no  Patient confirms/reports the following medications:  No current outpatient medications on file.   No current facility-administered medications for this visit.    Patient confirms/reports the following allergies:  No Known Allergies  No orders of the defined types were placed in this encounter.   AUTHORIZATION INFORMATION Primary Insurance: 1D#: Group #:  Secondary Insurance: 1D#: Group #:  SCHEDULE INFORMATION: Date: 06/04/21Time: Location:[facility]

## 2019-06-05 ENCOUNTER — Telehealth: Payer: Self-pay

## 2019-06-05 NOTE — Telephone Encounter (Signed)
Returned patients call to reschedule her 06/08/19 Colonoscopy with Dr. Servando Snare at [facility].  Unable to LVM on cell number due to voice mail being full.  I was able to LVM on home number.    Thanks,  [place], [place]

## 2019-06-06 ENCOUNTER — Telehealth: Payer: Self-pay

## 2019-06-06 ENCOUNTER — Other Ambulatory Visit: Admission: RE | Admit: 2019-06-06 | Payer: 59 | Source: Ambulatory Visit

## 2019-06-06 NOTE — Discharge Instructions (Signed)
General Anesthesia, Adult, Care After This sheet gives you information about how to care for yourself after your procedure. Your health care provider may also give you more specific instructions. If you have problems or questions, contact your health care provider. What can I expect after the procedure? After the procedure, the following side effects are common:  Pain or discomfort at the IV site.  Nausea.  Vomiting.  Sore throat.  Trouble concentrating.  Feeling cold or chills.  Weak or tired.  Sleepiness and fatigue.  Soreness and body aches. These side effects can affect parts of the body that were not involved in surgery. Follow these instructions at home:  For at least 24 hours after the procedure:  Have a responsible adult stay with you. It is important to have someone help care for you until you are awake and alert.  Rest as needed.  Do not: ? Participate in activities in which you could fall or become injured. ? Drive. ? Use heavy machinery. ? Drink alcohol. ? Take sleeping pills or medicines that cause drowsiness. ? Make important decisions or sign legal documents. ? Take care of children on your own. Eating and drinking  Follow any instructions from your health care provider about eating or drinking restrictions.  When you feel hungry, start by eating small amounts of foods that are soft and easy to digest (bland), such as toast. Gradually return to your regular diet.  Drink enough fluid to keep your urine pale yellow.  If you vomit, rehydrate by drinking water, juice, or clear broth. General instructions  If you have sleep apnea, surgery and certain medicines can increase your risk for breathing problems. Follow instructions from your health care provider about wearing your sleep device: ? Anytime you are sleeping, including during daytime naps. ? While taking prescription pain medicines, sleeping medicines, or medicines that make you drowsy.  Return to  your normal activities as told by your health care provider. Ask your health care provider what activities are safe for you.  Take over-the-counter and prescription medicines only as told by your health care provider.  If you smoke, do not smoke without supervision.  Keep all follow-up visits as told by your health care provider. This is important. Contact a health care provider if:  You have nausea or vomiting that does not get better with medicine.  You cannot eat or drink without vomiting.  You have pain that does not get better with medicine.  You are unable to pass urine.  You develop a skin rash.  You have a fever.  You have redness around your IV site that gets worse. Get help right away if:  You have difficulty breathing.  You have chest pain.  You have blood in your urine or stool, or you vomit blood. Summary  After the procedure, it is common to have a sore throat or nausea. It is also common to feel tired.  Have a responsible adult stay with you for the first 24 hours after general anesthesia. It is important to have someone help care for you until you are awake and alert.  When you feel hungry, start by eating small amounts of foods that are soft and easy to digest (bland), such as toast. Gradually return to your regular diet.  Drink enough fluid to keep your urine pale yellow.  Return to your normal activities as told by your health care provider. Ask your health care provider what activities are safe for you. This information is not   intended to replace advice given to you by your health care provider. Make sure you discuss any questions you have with your health care provider. Document Revised: 12/24/2016 Document Reviewed: 08/06/2016 Elsevier Patient Education  2020 Elsevier Inc.  

## 2019-06-06 NOTE — Telephone Encounter (Signed)
Colonoscopy has been rescheduled to 07/23/19 per patient request.  Referral updated.  New instructions sent via mychart. Pt advised of new COVID Test date Thursday 07/19/19.  Referral updated.  Thanks,  [place], [place]

## 2019-07-23 ENCOUNTER — Telehealth: Payer: Self-pay

## 2019-07-23 DIAGNOSIS — Z1211 Encounter for screening for malignant neoplasm of colon: Secondary | ICD-10-CM

## 2019-07-23 NOTE — Telephone Encounter (Signed)
Returned patients call to answer questions regarding her colonoscopy.  Patient was advised of her COVID testing date, however she will not be able to have it done due to work trip.    Colonoscopy has been rescheduled to 08/16/19 with Dr. Servando Snare from 08/02.  LVM with [facility] to notify of date change.  Instructions will be sent to patient via mychart.  Referral updated.  Thanks,  [place], [place]

## 2019-08-08 ENCOUNTER — Encounter: Payer: Self-pay | Admitting: Gastroenterology

## 2019-08-08 ENCOUNTER — Other Ambulatory Visit: Payer: Self-pay

## 2019-08-14 ENCOUNTER — Other Ambulatory Visit
Admission: RE | Admit: 2019-08-14 | Discharge: 2019-08-14 | Disposition: A | Discharge status: Home / Self Care | Payer: 59 | Source: Ambulatory Visit | Attending: Gastroenterology | Admitting: Gastroenterology

## 2019-08-14 ENCOUNTER — Other Ambulatory Visit: Payer: Self-pay

## 2019-08-14 DIAGNOSIS — Z01812 Encounter for preprocedural laboratory examination: Secondary | ICD-10-CM | POA: Diagnosis present

## 2019-08-14 DIAGNOSIS — Z20822 Contact with and (suspected) exposure to covid-19: Secondary | ICD-10-CM | POA: Diagnosis not present

## 2019-08-14 LAB — SARS CORONAVIRUS 2 (TAT 6-24 HRS): SARS Coronavirus 2: NEGATIVE

## 2019-08-16 ENCOUNTER — Encounter
Admission: RE | Disposition: A | Discharge status: Home / Self Care | Payer: Self-pay | Source: Home / Self Care | Attending: Gastroenterology

## 2019-08-16 ENCOUNTER — Ambulatory Visit
Admission: RE | Admit: 2019-08-16 | Discharge: 2019-08-16 | Disposition: A | Discharge status: Home / Self Care | Payer: 59 | Attending: Gastroenterology | Admitting: Gastroenterology

## 2019-08-16 ENCOUNTER — Other Ambulatory Visit: Payer: Self-pay

## 2019-08-16 ENCOUNTER — Ambulatory Visit: Payer: 59 | Admitting: Anesthesiology

## 2019-08-16 ENCOUNTER — Encounter: Payer: Self-pay | Admitting: Gastroenterology

## 2019-08-16 DIAGNOSIS — Z9851 Tubal ligation status: Secondary | ICD-10-CM | POA: Diagnosis not present

## 2019-08-16 DIAGNOSIS — Z1211 Encounter for screening for malignant neoplasm of colon: Secondary | ICD-10-CM

## 2019-08-16 HISTORY — DX: Presence of spectacles and contact lenses: Z97.3

## 2019-08-16 HISTORY — PX: COLONOSCOPY WITH PROPOFOL: SHX5780

## 2019-08-16 SURGERY — COLONOSCOPY WITH PROPOFOL
Anesthesia: General | Site: Rectum

## 2019-08-16 MED ORDER — STERILE WATER FOR IRRIGATION IR SOLN
Status: DC | PRN
  Administered 2019-08-16: .05 mL

## 2019-08-16 MED ORDER — ONDANSETRON HCL 4 MG/2ML IJ SOLN: 4.0000 mg | Freq: Once | INTRAMUSCULAR | Status: DC | PRN

## 2019-08-16 MED ORDER — LIDOCAINE HCL (CARDIAC) PF 100 MG/5ML IV SOSY
PREFILLED_SYRINGE | INTRAVENOUS | Status: DC | PRN
  Administered 2019-08-16: 30 mg via INTRAVENOUS

## 2019-08-16 MED ORDER — ACETAMINOPHEN 325 MG PO TABS: 325.0000 mg | ORAL_TABLET | ORAL | Status: DC | PRN

## 2019-08-16 MED ORDER — PROPOFOL 10 MG/ML IV BOLUS
INTRAVENOUS | Status: DC | PRN
  Administered 2019-08-16: 120 mg via INTRAVENOUS
  Administered 2019-08-16: 40 mg via INTRAVENOUS
  Administered 2019-08-16 (×2): 30 mg via INTRAVENOUS
  Administered 2019-08-16: 40 mg via INTRAVENOUS
  Administered 2019-08-16 (×2): 30 mg via INTRAVENOUS
  Administered 2019-08-16: 40 mg via INTRAVENOUS

## 2019-08-16 MED ORDER — SODIUM CHLORIDE 0.9 % IV SOLN: INTRAVENOUS | Status: DC

## 2019-08-16 MED ORDER — ACETAMINOPHEN 160 MG/5ML PO SOLN: 325.0000 mg | ORAL | Status: DC | PRN

## 2019-08-16 MED ORDER — LACTATED RINGERS IV SOLN: INTRAVENOUS | Status: DC

## 2019-08-16 SURGICAL SUPPLY — 8 items
FORCEPS BIOP RAD 4 LRG CAP 4 (CUTTING FORCEPS) IMPLANT
GOWN CVR UNV OPN BCK APRN NK (MISCELLANEOUS) ×2 IMPLANT
GOWN ISOL THUMB LOOP REG UNIV (MISCELLANEOUS) ×6
KIT ENDO PROCEDURE OLY (KITS) ×3 IMPLANT
MANIFOLD NEPTUNE II (INSTRUMENTS) ×3 IMPLANT
SNARE SHORT THROW 13M SML OVAL (MISCELLANEOUS) IMPLANT
TRAP ETRAP POLY (MISCELLANEOUS) IMPLANT
WATER STERILE IRR 250ML POUR (IV SOLUTION) ×3 IMPLANT

## 2019-08-16 NOTE — H&P (Signed)
Midge Minium, MD [facility] [address], [address] [place], [place] [address] Phone: [phone] Fax : [phone]  Primary Care Physician:  Tommie Sams, DO Primary Gastroenterologist:  Dr. Servando Snare  Pre-Procedure History & Physical: HPI:  Carmen George is a 54 y.o. female is here for a screening colonoscopy.   Past Medical History:  Diagnosis Date  . Chicken pox   . Wears contact lenses     Past Surgical History:  Procedure Laterality Date  . CESAREAN SECTION     x 3  . TUBAL LIGATION      Prior to Admission medications   Medication Sig Start Date End Date Taking? Authorizing Provider  Calcium-Vitamin D-Vitamin K (VIACTIV PO) Take by mouth daily.   Yes [provider]  Coenzyme Q10 (COQ10 PO) Take by mouth daily.   Yes [provider]  Multiple Vitamin (MULTIVITAMIN) capsule Take 1 capsule by mouth daily.   Yes [provider]  Omega-3 Fatty Acids (OMEGA 3 PO) Take by mouth.   Yes [provider]  Probiotic Product (PROBIOTIC PO) Take by mouth 3 (three) times daily.   Yes [provider]  TURMERIC PO Take by mouth daily.   Yes [provider]    Allergies as of 04/20/2019  . (No Known Allergies)    Family History  Problem Relation Age of Onset  . Hyperlipidemia Mother   . Hypertension Mother   . Heart disease Father   . Hypertension Father   . Mental illness Father   . Diabetes Father   . Breast cancer Neg Hx     Social History   Socioeconomic History  . Marital status: Married    Spouse name: Not on file  . Number of children: Not on file  . Years of education: Not on file  . Highest education level: Not on file  Occupational History  . Not on file  Tobacco Use  . Smoking status: Never Smoker  . Smokeless tobacco: Never Used  Vaping Use  . Vaping Use: Never used  Substance and Sexual Activity  . Alcohol use: Yes    Alcohol/week: 3.0 standard drinks    Types: 3 Standard drinks or equivalent per  week    Comment: 3-4/week  . Drug use: No  . Sexual activity: Yes    Partners: Male  Other Topics Concern  . Not on file  Social History Narrative  . Not on file   Social Determinants of Health   Financial Resource Strain:   . Difficulty of Paying Living Expenses:   Food Insecurity:   . Worried About Programme researcher, broadcasting/film/video in the Last Year:   . Barista in the Last Year:   Transportation Needs:   . Freight forwarder (Medical):   Marland Kitchen Lack of Transportation (Non-Medical):   Physical Activity:   . Days of Exercise per Week:   . Minutes of Exercise per Session:   Stress:   . Feeling of Stress :   Social Connections:   . Frequency of Communication with Friends and Family:   . Frequency of Social Gatherings with Friends and Family:   . Attends Religious Services:   . Active Member of Clubs or Organizations:   . Attends Banker Meetings:   Marland Kitchen Marital Status:   Intimate Partner Violence:   . Fear of Current or Ex-Partner:   . Emotionally Abused:   Marland Kitchen Physically Abused:   . Sexually Abused:     Review of  Systems: See HPI, otherwise negative ROS  Physical Exam: BP 109/60   Pulse (!) 58   Temp (!) 97.3 F (36.3 C) (Temporal)   Resp 16   Ht 5\' 4"  (1.626 m)   Wt 58.1 kg   LMP 06/05/2018 (Approximate)   SpO2 100%   BMI 21.97 kg/m  General:   Alert,  pleasant and cooperative in NAD Head:  Normocephalic and atraumatic. Neck:  Supple; no masses or thyromegaly. Lungs:  Clear throughout to auscultation.    Heart:  Regular rate and rhythm. Abdomen:  Soft, nontender and nondistended. Normal bowel sounds, without guarding, and without rebound.   Neurologic:  Alert and  oriented x4;  grossly normal neurologically.  Impression/Plan: Carmen George is now here to undergo a screening colonoscopy.  Risks, benefits, and alternatives regarding colonoscopy have been reviewed with the patient.  Questions have been answered.  All parties agreeable.

## 2019-08-16 NOTE — Anesthesia Preprocedure Evaluation (Signed)
Anesthesia Evaluation  Patient identified by MRN, date of birth, ID band Patient awake    Reviewed: Allergy & Precautions, NPO status , Patient's Chart, lab work & pertinent test results  Airway Mallampati: I  TM Distance: >3 FB Neck ROM: Full    Dental no notable dental hx.    Pulmonary neg pulmonary ROS,    Pulmonary exam normal breath sounds clear to auscultation       Cardiovascular Exercise Tolerance: Good negative cardio ROS Normal cardiovascular exam Rhythm:Regular Rate:Normal     Neuro/Psych negative neurological ROS  negative psych ROS   GI/Hepatic negative GI ROS, Neg liver ROS,   Endo/Other  negative endocrine ROS  Renal/GU negative Renal ROS  negative genitourinary   Musculoskeletal negative musculoskeletal ROS (+)   Abdominal   Peds negative pediatric ROS (+)  Hematology negative hematology ROS (+)   Anesthesia Other Findings   Reproductive/Obstetrics negative OB ROS                             Anesthesia Physical Anesthesia Plan  ASA: I  Anesthesia Plan: General   Post-op Pain Management:    Induction: Intravenous  PONV Risk Score and Plan: 2 and Treatment may vary due to age or medical condition and Propofol infusion  Airway Management Planned: Simple Face Mask  Additional Equipment:   Intra-op Plan:   Post-operative Plan:   Informed Consent: I have reviewed the patients History and Physical, chart, labs and discussed the procedure including the risks, benefits and alternatives for the proposed anesthesia with the patient or authorized representative who has indicated his/her understanding and acceptance.     Dental advisory given  Plan Discussed with: CRNA and Anesthesiologist  Anesthesia Plan Comments:         Anesthesia Quick Evaluation  Patient Active Problem List   Diagnosis Date Noted  . Annual physical exam 11/14/2015  . Breast cancer  screening 11/14/2015    CBC Latest Ref Rng & Units 11/14/2015  WBC 4.0 - 10.5 K/uL 11.4(H)  Hemoglobin 12.0 - 15.0 g/dL 57.3  Hematocrit 36 - 46 % 41.1  Platelets 150 - 400 K/uL 285.0   BMP Latest Ref Rng & Units 11/14/2015  Glucose 70 - 99 mg/dL 78  BUN 6 - 23 mg/dL 12  Creatinine 2.20 - 2.54 mg/dL 2.70  Sodium 623 - 762 mEq/L 143  Potassium 3.5 - 5.1 mEq/L 5.4(H)  Chloride 96 - 112 mEq/L 105  CO2 19 - 32 mEq/L 30  Calcium 8.4 - 10.5 mg/dL 83.1    Risks and benefits of anesthesia discussed at length, patient or surrogate demonstrates understanding. Appropriately NPO. Plan to proceed with anesthesia.  Corlis Leak, MD 08/16/19

## 2019-08-16 NOTE — Anesthesia Postprocedure Evaluation (Signed)
Anesthesia Post Note  Patient: Carmen George  Procedure(s) Performed: COLONOSCOPY WITH PROPOFOL (N/A Rectum)     Patient location during evaluation: Phase II Anesthesia Type: General Level of consciousness: awake and alert and oriented Pain management: pain level controlled Vital Signs Assessment: post-procedure vital signs reviewed and stable Respiratory status: respiratory function stable, nonlabored ventilation and spontaneous breathing Cardiovascular status: blood pressure returned to baseline and stable Postop Assessment: no backache and no headache Anesthetic complications: no   No complications documented.  Edwyna Ready

## 2019-08-16 NOTE — Op Note (Signed)
[facility] Gastroenterology Patient Name: Carmen George Procedure Date: 08/16/2019 9:07 AM MRN: [id number] Account #: [id number] Date of Birth: 02/26/1965 Admit Type: Outpatient Age: 54 Room: [facility] OR ROOM 01 Gender: Female Note Status: Finalized Procedure:             Colonoscopy Indications:           Screening for colorectal malignant neoplasm Providers:             Midge Minium MD, MD Referring MD:          Verdis Frederickson. Adriana Simas MD, MD (Referring MD) Medicines:             Propofol per Anesthesia Complications:         No immediate complications. Procedure:             Pre-Anesthesia Assessment:                        - Prior to the procedure, a History and Physical was                         performed, and patient medications and allergies were                         reviewed. The patient's tolerance of previous                         anesthesia was also reviewed. The risks and benefits                         of the procedure and the sedation options and risks                         were discussed with the patient. All questions were                         answered, and informed consent was obtained. Prior                         Anticoagulants: The patient has taken no previous                         anticoagulant or antiplatelet agents. ASA Grade                         Assessment: II - A patient with mild systemic disease.                         After reviewing the risks and benefits, the patient                         was deemed in satisfactory condition to undergo the                         procedure.                        After obtaining informed consent, the colonoscope was  passed under direct vision. Throughout the procedure,                         the patient's blood pressure, pulse, and oxygen                         saturations were monitored continuously. The                         Colonoscope was introduced through  the anus and                         advanced to the the cecum, identified by appendiceal                         orifice and ileocecal valve. The colonoscopy was                         performed without difficulty. The patient tolerated                         the procedure well. The quality of the bowel                         preparation was excellent. Findings:      The perianal and digital rectal examinations were normal.      The colon (entire examined portion) appeared normal. Impression:            - The entire examined colon is normal.                        - No specimens collected. Recommendation:        - Discharge patient to home.                        - Resume previous diet.                        - Continue present medications.                        - Repeat colonoscopy in 10 years for screening unless                         any change in family history or lower GI problems. Procedure Code(s):     --- Professional ---                        [phone], Colonoscopy, flexible; diagnostic, including                         collection of specimen(s) by brushing or washing, when                         performed (separate procedure) Diagnosis Code(s):     --- Professional ---                        Z12.11, Encounter for screening for malignant neoplasm  of colon CPT copyright 2019 [employer]. All rights reserved. The codes documented in this report are preliminary and upon coder review may  be revised to meet current compliance requirements. Midge Minium MD, MD 08/16/2019 9:33:57 AM This report has been signed electronically. Number of Addenda: 0 Note Initiated On: 08/16/2019 9:07 AM Scope Withdrawal Time: 0 hours 6 minutes 30 seconds  Total Procedure Duration: 0 hours 13 minutes 56 seconds  Estimated Blood Loss:  Estimated blood loss: none.      [facility]

## 2019-08-16 NOTE — Anesthesia Procedure Notes (Signed)
Date/Time: 08/16/2019 9:14 AM Performed by: Maree Krabbe, CRNA Pre-anesthesia Checklist: Patient identified, Emergency Drugs available, Suction available, Timeout performed and Patient being monitored Patient Re-evaluated:Patient Re-evaluated prior to induction Oxygen Delivery Method: Nasal cannula Placement Confirmation: positive ETCO2

## 2019-08-16 NOTE — Transfer of Care (Signed)
Immediate Anesthesia Transfer of Care Note  Patient: Carmen George  Procedure(s) Performed: COLONOSCOPY WITH PROPOFOL (N/A Rectum)  Patient Location: PACU  Anesthesia Type: General  Level of Consciousness: awake, alert  and patient cooperative  Airway and Oxygen Therapy: Patient Spontanous Breathing and Patient connected to supplemental oxygen  Post-op Assessment: Post-op Vital signs reviewed, Patient's Cardiovascular Status Stable, Respiratory Function Stable, Patent Airway and No signs of Nausea or vomiting  Post-op Vital Signs: Reviewed and stable  Complications: No complications documented.

## 2019-08-20 ENCOUNTER — Encounter: Payer: Self-pay | Admitting: Gastroenterology

## 2020-02-27 ENCOUNTER — Other Ambulatory Visit: Payer: Self-pay | Admitting: Obstetrics & Gynecology

## 2020-02-27 DIAGNOSIS — Z1231 Encounter for screening mammogram for malignant neoplasm of breast: Secondary | ICD-10-CM

## 2020-03-13 ENCOUNTER — Other Ambulatory Visit: Payer: Self-pay

## 2020-03-13 ENCOUNTER — Ambulatory Visit
Admission: RE | Admit: 2020-03-13 | Discharge: 2020-03-13 | Disposition: A | Discharge status: Home / Self Care | Payer: 59 | Source: Ambulatory Visit | Attending: Obstetrics & Gynecology | Admitting: Obstetrics & Gynecology

## 2020-03-13 DIAGNOSIS — Z1231 Encounter for screening mammogram for malignant neoplasm of breast: Secondary | ICD-10-CM | POA: Diagnosis present

## 2020-08-15 ENCOUNTER — Encounter: Payer: Self-pay | Admitting: Obstetrics & Gynecology

## 2020-08-15 ENCOUNTER — Other Ambulatory Visit: Payer: Self-pay

## 2020-08-15 ENCOUNTER — Ambulatory Visit (INDEPENDENT_AMBULATORY_CARE_PROVIDER_SITE_OTHER): Payer: 59 | Admitting: Obstetrics & Gynecology

## 2020-08-15 VITALS — BP 98/60 | Ht 64.0 in | Wt 116.0 lb

## 2020-08-15 DIAGNOSIS — Z01419 Encounter for gynecological examination (general) (routine) without abnormal findings: Secondary | ICD-10-CM | POA: Diagnosis not present

## 2020-08-15 NOTE — Patient Instructions (Signed)
Recommendations to boost your immunity to prevent illness such as viral flu and colds, including covid19, are as follows:       - - -  Vitamin K2 and Vitamin D3  - - - Take Vitamin K2 at 200-300 mcg daily (usually 2-3 pills daily of the over the counter formulation). Take Vitamin D3 at 3000-4000 U daily (usually 3-4 pills daily of the over the counter formulation). Studies show that these two at high normal levels in your system are very effective in keeping your immunity so strong and protective that you will be unlikely to contract viral illness such as those listed above.  Dr Tiburcio Pea  For Bones: Calcium 1500 mg daily Vitamin D at least 1000U daily  For Cholesterol: Recommendations include keeping total cholesterol below 200, LDL below 130 or even 100 when additional risk factor are present such as diabetes or heart disease, and HDL above 50.  Options include over the counter supplements or prescription therapy.  Based on your levels, I recommend we start with adding either Omega-3 (DHA or fish oil) supplement or Red Yeast Rice or Niacin as a natural alternative to reduce high cholesterol levels.

## 2020-08-15 NOTE — Progress Notes (Signed)
HPI:      [name] is a 55 y.o. [id number] who LMP was in the past, she presents today for her annual examination.  The patient has no complaints today. The patient is sexually active. Herlast pap: approximate date 2021 and was normal and last mammogram: approximate date 2022 and was normal.  The patient does perform self breast exams.  There is no notable family history of breast or ovarian cancer in her family. The patient is not taking hormone replacement therapy. Patient denies post-menopausal vaginal bleeding.  No period in 1.5 years; rare hot flash usually assoc w stress.  The patient has regular exercise: yes. The patient denies current symptoms of depression.    GYN Hx: Last Colonoscopy:1 year ago. Normal.  Last DEXA:  never  ago.    PMHx: Past Medical History:  Diagnosis Date   Chicken pox    Wears contact lenses    Past Surgical History:  Procedure Laterality Date   CESAREAN SECTION     x 3   COLONOSCOPY WITH PROPOFOL N/A 08/16/2019   Procedure: COLONOSCOPY WITH PROPOFOL;  Surgeon: Midge Minium, MD;  Location: [facility];  Service: Endoscopy;  Laterality: N/A;  priority 4   TUBAL LIGATION     Family History  Problem Relation Age of Onset   Hyperlipidemia Mother    Hypertension Mother    Heart disease Father    Hypertension Father    Mental illness Father    Diabetes Father    Breast cancer Neg Hx    Social History   Tobacco Use   Smoking status: Never   Smokeless tobacco: Never  Vaping Use   Vaping Use: Never used  Substance Use Topics   Alcohol use: Yes    Alcohol/week: 3.0 standard drinks    Types: 3 Standard drinks or equivalent per week    Comment: 3-4/week   Drug use: No    Current Outpatient Medications:    Calcium-Vitamin D-Vitamin K (VIACTIV PO), Take by mouth daily., Disp: , Rfl:    Coenzyme Q10 (COQ10 PO), Take by mouth daily., Disp: , Rfl:    Multiple Vitamin (MULTIVITAMIN) capsule, Take 1 capsule by mouth daily., Disp: , Rfl:     Omega-3 Fatty Acids (OMEGA 3 PO), Take by mouth., Disp: , Rfl:    Probiotic Product (PROBIOTIC PO), Take by mouth 3 (three) times daily., Disp: , Rfl:    TURMERIC PO, Take by mouth daily., Disp: , Rfl:  Allergies: Patient has no known allergies.  Review of Systems  Constitutional:  Negative for chills, fever and malaise/fatigue.  HENT:  Negative for congestion, sinus pain and sore throat.   Eyes:  Negative for blurred vision and pain.  Respiratory:  Negative for cough and wheezing.   Cardiovascular:  Negative for chest pain and leg swelling.  Gastrointestinal:  Negative for abdominal pain, constipation, diarrhea, heartburn, nausea and vomiting.  Genitourinary:  Negative for dysuria, frequency, hematuria and urgency.  Musculoskeletal:  Negative for back pain, joint pain, myalgias and neck pain.  Skin:  Negative for itching and rash.  Neurological:  Negative for dizziness, tremors and weakness.  Endo/Heme/Allergies:  Does not bruise/bleed easily.  Psychiatric/Behavioral:  Negative for depression. The patient is not nervous/anxious and does not have insomnia.    Objective: BP 98/60   Ht 5\' 4"  (1.626 m)   Wt 116 lb (52.6 kg)   LMP 06/05/2018 (Approximate)   BMI 19.91 kg/m   Filed Weights   08/15/20 0807  Weight:  116 lb (52.6 kg)   Body mass index is 19.91 kg/m. Physical Exam Constitutional:      General: She is not in acute distress.    Appearance: She is well-developed.  Genitourinary:     Bladder, rectum and urethral meatus normal.     No lesions in the vagina.     Right Labia: No rash, tenderness or lesions.    Left Labia: No tenderness, lesions or rash.    No vaginal bleeding.      Right Adnexa: not tender and no mass present.    Left Adnexa: not tender and no mass present.    No cervical motion tenderness, friability, lesion or polyp.     Uterus is not enlarged.     No uterine mass detected.    Pelvic exam was performed with patient in the lithotomy position.   Breasts:    Right: No mass, skin change or tenderness.     Left: No mass, skin change or tenderness.  HENT:     Head: Normocephalic and atraumatic. No laceration.     Right Ear: Hearing normal.     Left Ear: Hearing normal.     Mouth/Throat:     Pharynx: Uvula midline.  Eyes:     Pupils: Pupils are equal, round, and reactive to light.  Neck:     Thyroid: No thyromegaly.  Cardiovascular:     Rate and Rhythm: Normal rate and regular rhythm.     Heart sounds: No murmur heard.   No friction rub. No gallop.  Pulmonary:     Effort: Pulmonary effort is normal. No respiratory distress.     Breath sounds: Normal breath sounds. No wheezing.  Abdominal:     General: Bowel sounds are normal. There is no distension.     Palpations: Abdomen is soft.     Tenderness: There is no abdominal tenderness. There is no rebound.  Musculoskeletal:        General: Normal range of motion.     Cervical back: Normal range of motion and neck supple.  Neurological:     Mental Status: She is alert and oriented to person, place, and time.     Cranial Nerves: No cranial nerve deficit.  Skin:    General: Skin is warm and dry.  Psychiatric:        Judgment: Judgment normal.  Vitals reviewed.    Assessment: Annual Exam 1. Women's annual routine gynecological examination     Plan:            1.  Cervical Screening-  Pap smear schedule reviewed with patient  2. Breast screening- Exam annually and mammogram scheduled  3. Colonoscopy every 10 years, Hemoccult testing after age 9  4. Labs  - consider repeating next year (last year normal and borderline cholesterol Counseled as to herbal/vit support for bones, cholesterol, immunity  5. Counseling for hormonal therapy: none     F/U  Return in about 1 year (around 08/15/2021) for Annual.  Annamarie Major, MD, [facility], [facility] 08/15/2020  8:32 AM

## 2021-03-30 ENCOUNTER — Other Ambulatory Visit: Payer: Self-pay | Admitting: Obstetrics & Gynecology

## 2021-03-30 DIAGNOSIS — Z1231 Encounter for screening mammogram for malignant neoplasm of breast: Secondary | ICD-10-CM

## 2021-05-05 ENCOUNTER — Ambulatory Visit
Admission: RE | Admit: 2021-05-05 | Discharge: 2021-05-05 | Disposition: A | Discharge status: Home / Self Care | Payer: BC Managed Care – PPO | Source: Ambulatory Visit | Attending: Obstetrics & Gynecology | Admitting: Obstetrics & Gynecology

## 2021-05-05 ENCOUNTER — Other Ambulatory Visit: Payer: Self-pay | Admitting: Family Medicine

## 2021-05-05 DIAGNOSIS — Z1231 Encounter for screening mammogram for malignant neoplasm of breast: Secondary | ICD-10-CM | POA: Diagnosis not present

## 2021-12-28 DIAGNOSIS — E7800 Pure hypercholesterolemia, unspecified: Secondary | ICD-10-CM | POA: Insufficient documentation

## 2021-12-28 NOTE — Patient Instructions (Signed)

## 2021-12-31 ENCOUNTER — Encounter: Payer: Self-pay | Admitting: Nurse Practitioner

## 2021-12-31 ENCOUNTER — Ambulatory Visit: Payer: BC Managed Care – PPO | Admitting: Nurse Practitioner

## 2021-12-31 VITALS — BP 117/79 | HR 66 | Temp 98.2°F | Ht 63.74 in | Wt 125.2 lb

## 2021-12-31 DIAGNOSIS — F418 Other specified anxiety disorders: Secondary | ICD-10-CM | POA: Diagnosis not present

## 2021-12-31 DIAGNOSIS — Z1159 Encounter for screening for other viral diseases: Secondary | ICD-10-CM

## 2021-12-31 DIAGNOSIS — Z23 Encounter for immunization: Secondary | ICD-10-CM | POA: Diagnosis not present

## 2021-12-31 DIAGNOSIS — D508 Other iron deficiency anemias: Secondary | ICD-10-CM | POA: Diagnosis not present

## 2021-12-31 DIAGNOSIS — D649 Anemia, unspecified: Secondary | ICD-10-CM | POA: Insufficient documentation

## 2021-12-31 DIAGNOSIS — F321 Major depressive disorder, single episode, moderate: Secondary | ICD-10-CM

## 2021-12-31 HISTORY — DX: Other specified anxiety disorders: F41.8

## 2021-12-31 MED ORDER — CITALOPRAM HYDROBROMIDE 10 MG PO TABS: 10.0000 mg | ORAL_TABLET | Freq: Every day | ORAL | 3 refills | Status: DC

## 2021-12-31 NOTE — Assessment & Plan Note (Signed)
History of while donating blood, check CBC, iron, ferritin, and B12 today.  Continue supplement and adjust as needed.

## 2021-12-31 NOTE — Assessment & Plan Note (Addendum)
Ongoing at this time, situational due to her son's struggles.  Denies SI/HI.  Discussed all treatment options with her.  She is going to family therapy, which will offer benefit.  Also discussed with her looking into individual therapy to allow herself a safe space to discuss her thoughts.  Educated on medication use, SSRI family being main recommendation to start with.  She is interested in trial of medication.  Will start Celexa 10 MG daily, educated her on this and side effects that she is to notify provider of.  Discussed BLACK BOX warning.  Return in 6 weeks for follow-up.

## 2021-12-31 NOTE — Progress Notes (Signed)
New Patient Office Visit  Subjective    Patient ID: Carmen George, female    DOB: 1965-12-22  Age: 56 y.o. MRN: [id number]  CC:  Chief Complaint  Patient presents with   New Patient (Initial Visit)    Patient states that she has a little depression/anxiety and some weight gain    HPI QUILLA FREEZE presents for new patient visit to establish care.  Introduced to Publishing rights manager role and practice setting.  All questions answered.  Discussed provider/patient relationship and expectations.  Has not seen a PCP in over 5 years, follows with her GYN.  Follows with Dr. Jean Rosenthal, last saw 06/15/21.  Last menstrual cycle over one year ago.    Follows with ENT, obtains Botox via them.  Is up to date on vaccines with exception of Td which she would like to get today.  Shingrix and Flu + Covid vaccines received in past.    Has history of iron deficiency noted while donating blood, she takes iron daily.  DEPRESSION She does utilize yoga for therapy.  A lot of stressors over past year with middle child getting into marijuana.  Will be attending family counseling.  Her son is on medication for anxiety.  No other family members on medications.  Lots of crying spells.  She has gained some weight recently, which concerns her.   Mood status: exacerbated Psychotherapy/counseling: yes current Previous psychiatric medications: none Depressed mood: yes Anxious mood: yes Anhedonia: occasional Significant weight loss or gain: no Insomnia: yes hard to fall asleep Fatigue: yes Feelings of worthlessness or guilt: no Impaired concentration/indecisiveness: occasional Suicidal ideations: no Hopelessness: no Crying spells: yes    12/31/2021    8:29 AM  Depression screen PHQ 2/9  Decreased Interest 1  Down, Depressed, Hopeless 2  PHQ - 2 Score 3  Altered sleeping 3  Tired, decreased energy 1  Change in appetite 3  Feeling bad or failure about yourself  1  Trouble concentrating 1  Moving  slowly or fidgety/restless 1  Suicidal thoughts 0  PHQ-9 Score 13  Difficult doing work/chores Somewhat difficult       12/31/2021    8:29 AM  GAD 7 : Generalized Anxiety Score  Nervous, Anxious, on Edge 2  Control/stop worrying 2  Worry too much - different things 3  Trouble relaxing 2  Restless 1  Easily annoyed or irritable 2  Afraid - awful might happen 0  Total GAD 7 Score 12  Anxiety Difficulty Somewhat difficult     Outpatient Encounter Medications as of 12/31/2021  Medication Sig   Calcium-Vitamin D-Vitamin K (VIACTIV PO) Take by mouth daily.   citalopram (CELEXA) 10 MG tablet Take 1 tablet (10 mg total) by mouth daily.   Coenzyme Q10 (COQ10 PO) Take by mouth daily.   ferrous sulfate 325 (65 FE) MG EC tablet Take 325 mg by mouth 3 (three) times daily with meals.   magnesium 30 MG tablet Take 30 mg by mouth 2 (two) times daily.   Multiple Vitamin (MULTIVITAMIN) capsule Take 1 capsule by mouth daily.   Omega-3 Fatty Acids (FISH OIL CONCENTRATE) 1000 MG CAPS Take 1,000 mg by mouth daily at 12 noon.   Probiotic Product (PROBIOTIC PO) Take by mouth 3 (three) times daily.   TURMERIC PO Take by mouth daily.   [DISCONTINUED] Omega-3 Fatty Acids (OMEGA 3 PO) Take by mouth.   [DISCONTINUED] PROBIOTIC, LACTOBACILLUS, PO Take 3 tablets by mouth daily at 12 noon.   No facility-administered encounter  medications on file as of 12/31/2021.    Past Medical History:  Diagnosis Date   Chicken pox    Depression    Situational anxiety 12/31/2021   Wears contact lenses     Past Surgical History:  Procedure Laterality Date   CESAREAN SECTION     x 3   COLONOSCOPY WITH PROPOFOL N/A 08/16/2019   Procedure: COLONOSCOPY WITH PROPOFOL;  Surgeon: Midge Minium, MD;  Location: [facility];  Service: Endoscopy;  Laterality: N/A;  priority 4   TUBAL LIGATION      Family History  Problem Relation Age of Onset   Hyperlipidemia Mother    Hypertension Mother     Hypercholesterolemia Mother    Heart disease Father    Hypertension Father    Mental illness Father    Diabetes Father    Depression Maternal Grandmother    Depression Maternal Grandfather    Stroke Paternal Grandmother    Breast cancer Neg Hx     Social History   Socioeconomic History   Marital status: Married    Spouse name: Not on file   Number of children: Not on file   Years of education: Not on file   Highest education level: Not on file  Occupational History   Not on file  Tobacco Use   Smoking status: Never   Smokeless tobacco: Never  Vaping Use   Vaping Use: Never used  Substance and Sexual Activity   Alcohol use: Yes    Alcohol/week: 3.0 standard drinks of alcohol    Types: 3 Standard drinks or equivalent per week    Comment: 3-4/week   Drug use: No   Sexual activity: Yes    Partners: Male  Other Topics Concern   Not on file  Social History Narrative   Not on file   Social Determinants of Health   Financial Resource Strain: Low Risk  (12/31/2021)   Overall Financial Resource Strain (CARDIA)    Difficulty of Paying Living Expenses: Not hard at all  Food Insecurity: No Food Insecurity (12/31/2021)   Hunger Vital Sign    Worried About Running Out of Food in the Last Year: Never true    Ran Out of Food in the Last Year: Never true  Transportation Needs: No Transportation Needs (12/31/2021)   PRAPARE - Administrator, Civil Service (Medical): No    Lack of Transportation (Non-Medical): No  Physical Activity: Sufficiently Active (12/31/2021)   Exercise Vital Sign    Days of Exercise per Week: 5 days    Minutes of Exercise per Session: 30 min  Stress: Stress Concern Present (12/31/2021)   [employer] of Occupational Health - Occupational Stress Questionnaire    Feeling of Stress : To some extent  Social Connections: Socially Integrated (12/31/2021)   Social Connection and Isolation Panel [NHANES]    Frequency of Communication with  Friends and Family: More than three times a week    Frequency of Social Gatherings with Friends and Family: More than three times a week    Attends Religious Services: More than 4 times per year    Active Member of [employer] or Organizations: Yes    Attends Banker Meetings: More than 4 times per year    Marital Status: Married  Catering manager Violence: Not At Risk (12/31/2021)   Humiliation, Afraid, Rape, and Kick questionnaire    Fear of Current or Ex-Partner: No    Emotionally Abused: No    Physically Abused: No  Sexually Abused: No    ROS      Objective    BP 117/79   Pulse 66   Temp 98.2 F (36.8 C) (Oral)   Ht 5' 3.74" (1.619 m)   Wt 125 lb 3.2 oz (56.8 kg)   LMP 06/05/2018 (Approximate)   SpO2 98%   BMI 21.67 kg/m   Physical Exam Vitals and nursing note reviewed.  Constitutional:      General: She is awake. She is not in acute distress.    Appearance: She is well-developed and well-groomed. She is not ill-appearing or toxic-appearing.  HENT:     Head: Normocephalic.     Right Ear: Hearing and external ear normal.     Left Ear: Hearing and external ear normal.  Eyes:     General: Lids are normal.        Right eye: No discharge.        Left eye: No discharge.     Conjunctiva/sclera: Conjunctivae normal.     Pupils: Pupils are equal, round, and reactive to light.  Neck:     Thyroid: No thyromegaly.     Vascular: No carotid bruit.  Cardiovascular:     Rate and Rhythm: Normal rate and regular rhythm.     Heart sounds: Normal heart sounds. No murmur heard.    No gallop.  Pulmonary:     Effort: Pulmonary effort is normal. No accessory muscle usage or respiratory distress.     Breath sounds: Normal breath sounds.  Abdominal:     General: Bowel sounds are normal.     Palpations: Abdomen is soft.  Musculoskeletal:     Cervical back: Normal range of motion and neck supple.     Right lower leg: No edema.     Left lower leg: No edema.   Lymphadenopathy:     Cervical: No cervical adenopathy.  Skin:    General: Skin is warm and dry.  Neurological:     Mental Status: She is alert and oriented to person, place, and time.  Psychiatric:        Attention and Perception: Attention normal.        Mood and Affect: Mood normal.        Speech: Speech normal.        Behavior: Behavior normal. Behavior is cooperative.        Thought Content: Thought content normal.    Last CBC Lab Results  Component Value Date   WBC 11.4 (H) 11/14/2015   HGB 13.8 11/14/2015   HCT 41.1 11/14/2015   MCV 91.3 11/14/2015   RDW 12.7 11/14/2015   PLT 285.0 11/14/2015   Last metabolic panel Lab Results  Component Value Date   GLUCOSE 78 11/14/2015   NA 143 11/14/2015   K 5.4 (H) 11/14/2015   CL 105 11/14/2015   CO2 30 11/14/2015   BUN 12 11/14/2015   CREATININE 0.93 11/14/2015   CALCIUM 10.1 11/14/2015   PROT 7.4 11/14/2015   ALBUMIN 4.5 11/14/2015   BILITOT 0.4 11/14/2015   ALKPHOS 50 11/14/2015   AST 18 11/14/2015   ALT 9 11/14/2015   Last lipids Lab Results  Component Value Date   CHOL 208 (H) 04/13/2019   HDL 90 04/13/2019   LDLCALC 108 (H) 04/13/2019   TRIG 54 04/13/2019   CHOLHDL 2.3 04/13/2019   Last hemoglobin A1c No results found for: "HGBA1C" Last thyroid functions Lab Results  Component Value Date   TSH 2.210 04/13/2019    Assessment &  Plan:   Problem List Items Addressed This Visit       Other   Absolute anemia    History of while donating blood, check CBC, iron, ferritin, and B12 today.  Continue supplement and adjust as needed.      Relevant Medications   ferrous sulfate 325 (65 FE) MG EC tablet   Other Relevant Orders   Iron Binding Cap (TIBC)([facility])   Ferritin   Vitamin B12   Depression, major, single episode, moderate (HCC) - Primary    Ongoing at this time, situational due to her son's struggles.  Denies SI/HI.  Discussed all treatment options with her.  She is going to family  therapy, which will offer benefit.  Also discussed with her looking into individual therapy to allow herself a safe space to discuss her thoughts.  Educated on medication use, SSRI family being main recommendation to start with.  She is interested in trial of medication.  Will start Celexa 10 MG daily, educated her on this and side effects that she is to notify provider of.  Discussed BLACK BOX warning.  Return in 6 weeks for follow-up.      Relevant Medications   citalopram (CELEXA) 10 MG tablet   Other Relevant Orders   CBC with Differential/Platelet   Basic metabolic panel   TSH   Situational anxiety    Refer to depression plan of care.      Relevant Medications   citalopram (CELEXA) 10 MG tablet   Other Visit Diagnoses     Need for Td vaccine       Td vaccine in office today, discussed with patient.   Relevant Orders   Td vaccine greater than or equal to 7yo preservative free IM (Completed)   Need for hepatitis C screening test       Hep C screening in office today, discussed with patient.   Relevant Orders   Hepatitis C antibody       Return in about 6 weeks (around 02/11/2022) for Anxiety -- started Celexa.   Marjie Skiff, NP

## 2021-12-31 NOTE — Assessment & Plan Note (Signed)
Refer to depression plan of care. 

## 2022-01-01 LAB — BASIC METABOLIC PANEL
BUN/Creatinine Ratio: 13 (ref 9–23)
BUN: 11 mg/dL (ref 6–24)
CO2: 23 mmol/L (ref 20–29)
Calcium: 9.9 mg/dL (ref 8.7–10.2)
Chloride: 101 mmol/L (ref 96–106)
Creatinine, Ser: 0.86 mg/dL (ref 0.57–1.00)
Glucose: 106 mg/dL — ABNORMAL HIGH (ref 70–99)
Potassium: 4.7 mmol/L (ref 3.5–5.2)
Sodium: 139 mmol/L (ref 134–144)
eGFR: 80 mL/min/{1.73_m2} (ref >59)

## 2022-01-01 LAB — CBC WITH DIFFERENTIAL/PLATELET
Basophils Absolute: 0 10*3/uL (ref 0.0–0.2)
Basos: 1 %
EOS (ABSOLUTE): 0.1 10*3/uL (ref 0.0–0.4)
Eos: 1 %
Hematocrit: 41.4 % (ref 34.0–46.6)
Hemoglobin: 13.4 g/dL (ref 11.1–15.9)
Immature Grans (Abs): 0 10*3/uL (ref 0.0–0.1)
Immature Granulocytes: 0 %
Lymphocytes Absolute: 1.3 10*3/uL (ref 0.7–3.1)
Lymphs: 20 %
MCH: 30.8 pg (ref 26.6–33.0)
MCHC: 32.4 g/dL (ref 31.5–35.7)
MCV: 95 fL (ref 79–97)
Monocytes Absolute: 0.4 10*3/uL (ref 0.1–0.9)
Monocytes: 6 %
Neutrophils Absolute: 4.5 10*3/uL (ref 1.4–7.0)
Neutrophils: 72 %
Platelets: 229 10*3/uL (ref 150–450)
RBC: 4.35 x10E6/uL (ref 3.77–5.28)
RDW: 12.3 % (ref 11.7–15.4)
WBC: 6.2 10*3/uL (ref 3.4–10.8)

## 2022-01-01 LAB — IRON AND TIBC
Iron Saturation: 19 % (ref 15–55)
Iron: 56 ug/dL (ref 27–159)
Total Iron Binding Capacity: 296 ug/dL (ref 250–450)
UIBC: 240 ug/dL (ref 131–425)

## 2022-01-01 LAB — VITAMIN B12: Vitamin B-12: 725 pg/mL (ref 232–1245)

## 2022-01-01 LAB — FERRITIN: Ferritin: 47 ng/mL (ref 15–150)

## 2022-01-01 LAB — TSH: TSH: 2.59 u[IU]/mL (ref 0.450–4.500)

## 2022-01-01 LAB — HEPATITIS C ANTIBODY: Hep C Virus Ab: NONREACTIVE

## 2022-01-01 NOTE — Progress Notes (Signed)
Contacted via MyChart   Good afternoon Carmen George, it was so nice to meet you.  Your labs have returned and overall these look nice and stable.  No changes needed!!  Great job!!  In future I would like to check cholesterol levels, but would like you to be fasting for that.:) Keep being awesome!!  Thank you for allowing me to participate in your care.  I appreciate you. Kindest regards, Milaina Sher

## 2022-01-23 ENCOUNTER — Other Ambulatory Visit: Payer: Self-pay | Admitting: Nurse Practitioner

## 2022-01-25 NOTE — Telephone Encounter (Signed)
Requested Prescriptions  Pending Prescriptions Disp Refills   citalopram (CELEXA) 10 MG tablet [Pharmacy Med Name: CITALOPRAM HBR 10 MG TABLET] 90 tablet 0    Sig: TAKE 1 TABLET BY MOUTH EVERY DAY     Psychiatry:  Antidepressants - SSRI Passed - 01/23/2022  8:32 AM      Passed - Completed PHQ-2 or PHQ-9 in the last 360 days      Passed - Valid encounter within last 6 months    Recent Outpatient Visits           3 weeks ago Depression, major, single episode, moderate ([place])   [place], Barbaraann Faster, NP       Future Appointments             In 2 weeks [place], Barbaraann Faster, NP [facility]

## 2022-02-07 NOTE — Patient Instructions (Signed)

## 2022-02-12 ENCOUNTER — Ambulatory Visit: Payer: BC Managed Care – PPO | Admitting: Nurse Practitioner

## 2022-02-12 ENCOUNTER — Encounter: Payer: Self-pay | Admitting: Nurse Practitioner

## 2022-02-12 VITALS — BP 101/64 | HR 67 | Temp 98.3°F | Ht 63.74 in | Wt 124.1 lb

## 2022-02-12 DIAGNOSIS — F418 Other specified anxiety disorders: Secondary | ICD-10-CM | POA: Diagnosis not present

## 2022-02-12 DIAGNOSIS — F321 Major depressive disorder, single episode, moderate: Secondary | ICD-10-CM | POA: Diagnosis not present

## 2022-02-12 MED ORDER — CITALOPRAM HYDROBROMIDE 20 MG PO TABS: 20.0000 mg | ORAL_TABLET | Freq: Every day | ORAL | 4 refills | Status: DC

## 2022-02-12 MED ORDER — BUSPIRONE HCL 5 MG PO TABS: 5.0000 mg | ORAL_TABLET | Freq: Two times a day (BID) | ORAL | 4 refills | Status: DC

## 2022-02-12 NOTE — Assessment & Plan Note (Signed)
Refer to depression plan of care.

## 2022-02-12 NOTE — Progress Notes (Signed)
BP 101/64   Pulse 67   Temp 98.3 F (36.8 C) (Oral)   Ht 5' 3.74" (1.619 m)   Wt 124 lb 1.6 oz (56.3 kg)   LMP 06/05/2018 (Approximate)   SpO2 98%   BMI 21.48 kg/m    Subjective:    Patient ID: Carmen George, female    DOB: 1965-06-28, 57 y.o.   MRN: [id number]  HPI: Carmen George is a 57 y.o. female  Chief Complaint  Patient presents with   Anxiety    Patient thinks that Celexa is helping. Has been have a bad few weeks but was working when she started   ANXIETY/STRESS Follow-up today for anxiety, started on Celexa 10 MG at visit on 12/31/21. Has had some increased stressors with her middle child getting into marijuana.  Is attending family counseling -- once a month.  Reports taking it at night, as makes her sleepy.  Felt like it was helping, but in last few weeks has had a lot of stressors.   Duration:uncontrolled Anxious mood: yes  Excessive worrying: yes Irritability:  not as much patience at work   Sweating: no Nausea: yes Palpitations:no Hyperventilation: no Panic attacks: no Agoraphobia: no  Obscessions/compulsions: no Depressed mood: yes    Mar 01, 2022    9:37 AM 12/31/2021    8:29 AM  Depression screen PHQ March 01, 2022  Decreased Interest 3 1  Down, Depressed, Hopeless 3 2  PHQ - 2 Score 6 3  Altered sleeping 2 3  Tired, decreased energy 2 1  Change in appetite 2 3  Feeling bad or failure about yourself  0 1  Trouble concentrating 1 1  Moving slowly or fidgety/restless 0 1  Suicidal thoughts 0 0  PHQ-9 Score 13 13  Difficult doing work/chores Somewhat difficult Somewhat difficult  Anhedonia: yes Weight changes: no Insomnia: yes hard to stay asleep  Hypersomnia: no Fatigue/loss of energy: yes Feelings of worthlessness: no Feelings of guilt: no Impaired concentration/indecisiveness: no Suicidal ideations: no  Crying spells: yes Recent Stressors/Life Changes: yes   Relationship problems: no   Family stress: yes     Financial stress: no    Job  stress: no    Recent death/loss: no     03-01-2022    9:37 AM 12/31/2021    8:29 AM  GAD 7 : Generalized Anxiety Score  Nervous, Anxious, on Edge 2 2  Control/stop worrying 2 2  Worry too much - different things 0 3  Trouble relaxing 0 2  Restless 0 1  Easily annoyed or irritable 2 2  Afraid - awful might happen 0 0  Total GAD 7 Score 6 12  Anxiety Difficulty Somewhat difficult Somewhat difficult   Relevant past medical, surgical, family and social history reviewed and updated as indicated. Interim medical history since our last visit reviewed. Allergies and medications reviewed and updated.  Review of Systems  Constitutional:  Negative for activity change, appetite change, diaphoresis, fatigue and fever.  Respiratory:  Negative for cough, chest tightness and shortness of breath.   Cardiovascular:  Negative for chest pain, palpitations and leg swelling.  Gastrointestinal: Negative.   Endocrine: Negative for cold intolerance, heat intolerance, polydipsia, polyphagia and polyuria.  Neurological:  Negative for dizziness, syncope, weakness, light-headedness, numbness and headaches.  Psychiatric/Behavioral:  Positive for sleep disturbance. Negative for decreased concentration, self-injury and suicidal ideas. The patient is nervous/anxious.     Per HPI unless specifically indicated above     Objective:    BP 101/64  Pulse 67   Temp 98.3 F (36.8 C) (Oral)   Ht 5' 3.74" (1.619 m)   Wt 124 lb 1.6 oz (56.3 kg)   LMP 06/05/2018 (Approximate)   SpO2 98%   BMI 21.48 kg/m   Wt Readings from Last 3 Encounters:  02/12/22 124 lb 1.6 oz (56.3 kg)  12/31/21 125 lb 3.2 oz (56.8 kg)  08/15/20 116 lb (52.6 kg)    Physical Exam Vitals and nursing note reviewed.  Constitutional:      General: She is awake. She is not in acute distress.    Appearance: She is well-developed and well-groomed. She is not ill-appearing or toxic-appearing.  HENT:     Head: Normocephalic.     Right Ear:  Hearing normal.     Left Ear: Hearing normal.     Nose: Nose normal.     Mouth/Throat:     Mouth: Mucous membranes are moist.  Eyes:     General: Lids are normal.        Right eye: No discharge.        Left eye: No discharge.     Conjunctiva/sclera: Conjunctivae normal.     Pupils: Pupils are equal, round, and reactive to light.  Neck:     Thyroid: No thyromegaly.     Vascular: No carotid bruit or JVD.  Cardiovascular:     Rate and Rhythm: Normal rate and regular rhythm.     Heart sounds: Normal heart sounds. No murmur heard.    No gallop.  Pulmonary:     Effort: Pulmonary effort is normal.     Breath sounds: Normal breath sounds.  Abdominal:     General: Bowel sounds are normal.     Palpations: Abdomen is soft.  Musculoskeletal:     Cervical back: Normal range of motion and neck supple.     Right lower leg: No edema.     Left lower leg: No edema.  Lymphadenopathy:     Cervical: No cervical adenopathy.  Skin:    General: Skin is warm and dry.  Neurological:     Mental Status: She is alert and oriented to person, place, and time.  Psychiatric:        Attention and Perception: Attention normal.        Mood and Affect: Mood normal.        Behavior: Behavior normal. Behavior is cooperative.        Thought Content: Thought content normal.        Judgment: Judgment normal.    Results for orders placed or performed in visit on 12/31/21  CBC with Differential/Platelet  Result Value Ref Range   WBC 6.2 3.4 - 10.8 x10E3/uL   RBC 4.35 3.77 - 5.28 x10E6/uL   Hemoglobin 13.4 11.1 - 15.9 g/dL   Hematocrit 41.4 34.0 - 46.6 %   MCV 95 79 - 97 fL   MCH 30.8 26.6 - 33.0 pg   MCHC 32.4 31.5 - 35.7 g/dL   RDW 12.3 11.7 - 15.4 %   Platelets 229 150 - 450 x10E3/uL   Neutrophils 72 Not Estab. %   Lymphs 20 Not Estab. %   Monocytes 6 Not Estab. %   Eos 1 Not Estab. %   Basos 1 Not Estab. %   Neutrophils Absolute 4.5 1.4 - 7.0 x10E3/uL   Lymphocytes Absolute 1.3 0.7 - 3.1 x10E3/uL    Monocytes Absolute 0.4 0.1 - 0.9 x10E3/uL   EOS (ABSOLUTE) 0.1 0.0 - 0.4 x10E3/uL  Basophils Absolute 0.0 0.0 - 0.2 x10E3/uL   Immature Granulocytes 0 Not Estab. %   Immature Grans (Abs) 0.0 0.0 - 0.1 [id number]  Basic metabolic panel  Result Value Ref Range   Glucose 106 (H) 70 - 99 mg/dL   BUN 11 6 - 24 mg/dL   Creatinine, Ser 0.86 0.57 - 1.00 mg/dL   eGFR 80 >59 mL/min/1.73   BUN/Creatinine Ratio 13 9 - 23   Sodium 139 134 - 144 mmol/L   Potassium 4.7 3.5 - 5.2 mmol/L   Chloride 101 96 - 106 mmol/L   CO2 23 20 - 29 mmol/L   Calcium 9.9 8.7 - 10.2 mg/dL  TSH  Result Value Ref Range   TSH 2.590 0.450 - 4.500 uIU/mL  Hepatitis C antibody  Result Value Ref Range   Hep C Virus Ab Non Reactive Non Reactive  Iron Binding Cap (TIBC)([facility])  Result Value Ref Range   Total Iron Binding Capacity 296 250 - 450 ug/dL   UIBC 240 131 - 425 ug/dL   Iron 56 27 - 159 ug/dL   Iron Saturation 19 15 - 55 %  Ferritin  Result Value Ref Range   Ferritin 47 15 - 150 ng/mL  Vitamin B12  Result Value Ref Range   Vitamin B-12 725 232 - 1,245 pg/mL      Assessment & Plan:   Problem List Items Addressed This Visit       Other   Depression, major, single episode, moderate (HCC) - Primary    Chronic, ongoing with some improvement.  Denies SI/HI.  She is going to family therapy, which will offer benefit.  Educated on medication use, SSRI family being main recommendation to start with.  Increase Celexa to 20 MG daily, educated her on this and side effects that she is to notify provider of + add on Buspar to start out taking as needed, but educated if needs to take twice a day that is okay.  Discussed BLACK BOX warning.  Return in 6 weeks for follow-up.      Relevant Medications   citalopram (CELEXA) 20 MG tablet   busPIRone (BUSPAR) 5 MG tablet   Situational anxiety    Refer to depression plan of care.      Relevant Medications   citalopram (CELEXA) 20 MG tablet   busPIRone  (BUSPAR) 5 MG tablet     Follow up plan: Return in about 6 weeks (around 03/26/2022) for Anxiety -- can be virtual.

## 2022-02-12 NOTE — Assessment & Plan Note (Addendum)
Chronic, ongoing with some improvement.  Denies SI/HI.  She is going to family therapy, which will offer benefit.  Educated on medication use, SSRI family being main recommendation to start with.  Increase Celexa to 20 MG daily, educated her on this and side effects that she is to notify provider of + add on Buspar to start out taking as needed, but educated if needs to take twice a day that is okay.  Discussed BLACK BOX warning.  Return in 6 weeks for follow-up.

## 2022-03-29 ENCOUNTER — Telehealth: Payer: BC Managed Care – PPO | Admitting: Nurse Practitioner

## 2022-04-04 NOTE — Patient Instructions (Signed)
Managing Anxiety, Adult After being diagnosed with anxiety, you may be relieved to know why you have felt or behaved a certain way. You may also feel overwhelmed about the treatment ahead and what it will mean for your life. With care and support, you can manage your anxiety. How to manage lifestyle changes Understanding the difference between stress and anxiety Although stress can play a role in anxiety, it is not the same as anxiety. Stress is your body's reaction to life changes and events, both good and bad. Stress is often caused by something external, such as a deadline, test, or competition. It normally goes away after the event has ended and will last just a few hours. But, stress can be ongoing and can lead to more than just stress. Anxiety is caused by something internal, such as imagining a terrible outcome or worrying that something will go wrong that will greatly upset you. Anxiety often does not go away even after the event is over, and it can become a long-term (chronic) worry. Lowering stress and anxiety Talk with your health care provider or a counselor to learn more about lowering anxiety and stress. They may suggest tension-reduction techniques, such as: Music. Spend time creating or listening to music that you enjoy and that inspires you. Mindfulness-based meditation. Practice being aware of your normal breaths while not trying to control your breathing. It can be done while sitting or walking. Centering prayer. Focus on a word, phrase, or sacred image that means something to you and brings you peace. Deep breathing. Expand your stomach and inhale slowly through your nose. Hold your breath for 3-5 seconds. Then breathe out slowly, letting your stomach muscles relax. Self-talk. Learn to notice and spot thought patterns that lead to anxiety reactions. Change those patterns to thoughts that feel peaceful. Muscle relaxation. Take time to tense muscles and then relax them. Choose a  tension-reduction technique that fits your lifestyle and personality. These techniques take time and practice. Set aside 5-15 minutes a day to do them. Specialized therapists can offer counseling and training in these techniques. The training to help with anxiety may be covered by some insurance plans. Other things you can do to manage stress and anxiety include: Keeping a stress diary. This can help you learn what triggers your reaction and then learn ways to manage your response. Thinking about how you react to certain situations. You may not be able to control everything, but you can control your response. Making time for activities that help you relax and not feeling guilty about spending your time in this way. Doing visual imagery. This involves imagining or creating mental pictures to help you relax. Practicing yoga. Through yoga poses, you can lower tension and relax.  Medicines Medicines for anxiety include: Antidepressant medicines. These are usually prescribed for long-term daily control. Anti-anxiety medicines. These may be added in severe cases, especially when panic attacks occur. When used together, medicines, psychotherapy, and tension-reduction techniques may be the most effective treatment. Relationships Relationships can play a big part in helping you recover. Spend more time connecting with trusted friends and family members. Think about going to couples counseling if you have a partner, taking family education classes, or going to family therapy. Therapy can help you and others better understand your anxiety. How to recognize changes in your anxiety Everyone responds differently to treatment for anxiety. Recovery from anxiety happens when symptoms lessen and stop interfering with your daily life at home or work. This may mean that you   will start to: Have better concentration and focus. Worry will interfere less in your daily thinking. Sleep better. Be less irritable. Have more  energy. Have improved memory. Try to recognize when your condition is getting worse. Contact your provider if your symptoms interfere with home or work and you feel like your condition is not improving. Follow these instructions at home: Activity Exercise. Adults should: Exercise for at least 150 minutes each week. The exercise should increase your heart rate and make you sweat (moderate-intensity exercise). Do strengthening exercises at least twice a week. Get the right amount and quality of sleep. Most adults need 7-9 hours of sleep each night. Lifestyle  Eat a healthy diet that includes plenty of vegetables, fruits, whole grains, low-fat dairy products, and lean protein. Do not eat a lot of foods that are high in fats, added sugars, or salt (sodium). Make choices that simplify your life. Do not use any products that contain nicotine or tobacco. These products include cigarettes, chewing tobacco, and vaping devices, such as e-cigarettes. If you need help quitting, ask your provider. Avoid caffeine, alcohol, and certain over-the-counter cold medicines. These may make you feel worse. Ask your pharmacist which medicines to avoid. General instructions Take over-the-counter and prescription medicines only as told by your provider. Keep all follow-up visits. This is to make sure you are managing your anxiety well or if you need more support. Where to find support You can get help and support from: Self-help groups. Online and community organizations. A trusted spiritual leader. Couples counseling. Family education classes. Family therapy. Where to find more information You may find that joining a support group helps you deal with your anxiety. The following sources can help you find counselors or support groups near you: Mental Health America: mentalhealthamerica.net Anxiety and Depression Association of America (ADAA): adaa.org National Alliance on Mental Illness (NAMI): nami.org Contact  a health care provider if: You have a hard time staying focused or finishing tasks. You spend many hours a day feeling worried about everyday life. You are very tired because you cannot stop worrying. You start to have headaches or often feel tense. You have chronic nausea or diarrhea. Get help right away if: Your heart feels like it is racing. You have shortness of breath. You have thoughts of hurting yourself or others. Get help right away if you feel like you may hurt yourself or others, or have thoughts about taking your own life. Go to your nearest emergency room or: Call 911. Call the National Suicide Prevention Lifeline at 1-800-273-8255 or 988. This is open 24 hours a day. Text the Crisis Text Line at 741741. This information is not intended to replace advice given to you by your health care provider. Make sure you discuss any questions you have with your health care provider. Document Revised: 09/29/2021 Document Reviewed: 04/13/2020 Elsevier Patient Education  2023 Elsevier Inc.  

## 2022-04-06 ENCOUNTER — Telehealth (INDEPENDENT_AMBULATORY_CARE_PROVIDER_SITE_OTHER): Payer: BC Managed Care – PPO | Admitting: Nurse Practitioner

## 2022-04-06 ENCOUNTER — Encounter: Payer: Self-pay | Admitting: Nurse Practitioner

## 2022-04-06 DIAGNOSIS — F418 Other specified anxiety disorders: Secondary | ICD-10-CM

## 2022-04-06 DIAGNOSIS — F321 Major depressive disorder, single episode, moderate: Secondary | ICD-10-CM

## 2022-04-06 MED ORDER — BUSPIRONE HCL 10 MG PO TABS: 10.0000 mg | ORAL_TABLET | Freq: Three times a day (TID) | ORAL | 4 refills | Status: DC

## 2022-04-06 NOTE — Progress Notes (Signed)
LMP 06/05/2018 (Approximate)    Subjective:    Patient ID: Carmen George, female    DOB: 18-Aug-1965, 57 y.o.   MRN: [id number]  HPI: Carmen George is a 57 y.o. female  Chief Complaint  Patient presents with   Anxiety   Depression   This visit was completed via video visit through MyChart due to the restrictions of the COVID-19 pandemic. All issues as above were discussed and addressed. Physical exam was done as above through visual confirmation on video through MyChart. If it was felt that the patient should be evaluated in the office, they were directed there. The patient verbally consented to this visit. Location of the patient: home Location of the provider: work Those involved with this call:  Provider: Marnee Guarneri, DNP CMA: Frazier Butt, [place] Desk/Registration: Leota Jacobsen  Time spent on call:  21 minutes with patient face to face via video conference. More than 50% of this time was spent in counseling and coordination of care. 15 minutes total spent in review of patient's record and preparation of their chart.  I verified patient identity using two factors (patient name and date of birth). Patient consents verbally to being seen via telemedicine visit today.    ANXIETY/DEPRESSION At last visit (02/12/22) increased Celexa to 20 MG, is tolerating change well and noticing difference.  Continues Buspar 5 MG BID started at last visit and tolerating. Mood status: stable Satisfied with current treatment?: yes Symptom severity: moderate  Duration of current treatment : chronic Side effects: no Medication compliance: good compliance Psychotherapy/counseling: yes current Depressed mood: yes Anxious mood: yes Anhedonia: no Significant weight loss or gain: no Insomnia: yes hard to fall asleep -- currently no medications Fatigue: yes Feelings of worthlessness or guilt: no Impaired concentration/indecisiveness: yes Suicidal ideations: no Hopelessness: no Crying  spells: no -- this has improved a lot    04/06/2022    1:54 PM 02/12/2022    9:37 AM 12/31/2021    8:29 AM  Depression screen PHQ 2/9  Decreased Interest 1 3 1   Down, Depressed, Hopeless 2 3 2   PHQ - 2 Score 3 6 3   Altered sleeping 3 2 3   Tired, decreased energy 1 2 1   Change in appetite 2 2 3   Feeling bad or failure about yourself  1 0 1  Trouble concentrating 1 1 1   Moving slowly or fidgety/restless 0 0 1  Suicidal thoughts 0 0 0  PHQ-9 Score 11 13 13   Difficult doing work/chores Somewhat difficult Somewhat difficult Somewhat difficult       04/06/2022    1:58 PM 02/12/2022    9:37 AM 12/31/2021    8:29 AM  GAD 7 : Generalized Anxiety Score  Nervous, Anxious, on Edge 2 2 2   Control/stop worrying 2 2 2   Worry too much - different things 2 0 3  Trouble relaxing 2 0 2  Restless 3 0 1  Easily annoyed or irritable 3 2 2   Afraid - awful might happen 1 0 0  Total GAD 7 Score 15 6 12   Anxiety Difficulty Somewhat difficult Somewhat difficult Somewhat difficult   Relevant past medical, surgical, family and social history reviewed and updated as indicated. Interim medical history since our last visit reviewed. Allergies and medications reviewed and updated.  Review of Systems  Constitutional:  Negative for activity change, appetite change, diaphoresis, fatigue and fever.  Respiratory:  Negative for cough, chest tightness and shortness of breath.   Cardiovascular:  Negative for chest  pain, palpitations and leg swelling.  Gastrointestinal: Negative.   Endocrine: Negative for cold intolerance, heat intolerance, polydipsia, polyphagia and polyuria.  Neurological:  Negative for dizziness, syncope, weakness, light-headedness, numbness and headaches.  Psychiatric/Behavioral:  Positive for sleep disturbance. Negative for decreased concentration, self-injury and suicidal ideas. The patient is nervous/anxious.     Per HPI unless specifically indicated above     Objective:    LMP 06/05/2018  (Approximate)   Wt Readings from Last 3 Encounters:  02/12/22 124 lb 1.6 oz (56.3 kg)  12/31/21 125 lb 3.2 oz (56.8 kg)  08/15/20 116 lb (52.6 kg)    Physical Exam Vitals and nursing note reviewed.  Constitutional:      General: She is awake. She is not in acute distress.    Appearance: She is well-developed. She is not ill-appearing.  HENT:     Head: Normocephalic.     Right Ear: Hearing normal.     Left Ear: Hearing normal.  Eyes:     General: Lids are normal.        Right eye: No discharge.        Left eye: No discharge.     Conjunctiva/sclera: Conjunctivae normal.  Pulmonary:     Effort: Pulmonary effort is normal. No accessory muscle usage or respiratory distress.  Musculoskeletal:     Cervical back: Normal range of motion.  Neurological:     Mental Status: She is alert and oriented to person, place, and time.  Psychiatric:        Attention and Perception: Attention normal.        Mood and Affect: Mood normal.        Behavior: Behavior normal. Behavior is cooperative.        Thought Content: Thought content normal.        Judgment: Judgment normal.    Results for orders placed or performed in visit on 12/31/21  CBC with Differential/Platelet  Result Value Ref Range   WBC 6.2 3.4 - 10.8 x10E3/uL   RBC 4.35 3.77 - 5.28 x10E6/uL   Hemoglobin 13.4 11.1 - 15.9 g/dL   Hematocrit 41.4 34.0 - 46.6 %   MCV 95 79 - 97 fL   MCH 30.8 26.6 - 33.0 pg   MCHC 32.4 31.5 - 35.7 g/dL   RDW 12.3 11.7 - 15.4 %   Platelets 229 150 - 450 x10E3/uL   Neutrophils 72 Not Estab. %   Lymphs 20 Not Estab. %   Monocytes 6 Not Estab. %   Eos 1 Not Estab. %   Basos 1 Not Estab. %   Neutrophils Absolute 4.5 1.4 - 7.0 x10E3/uL   Lymphocytes Absolute 1.3 0.7 - 3.1 x10E3/uL   Monocytes Absolute 0.4 0.1 - 0.9 x10E3/uL   EOS (ABSOLUTE) 0.1 0.0 - 0.4 x10E3/uL   Basophils Absolute 0.0 0.0 - 0.2 x10E3/uL   Immature Granulocytes 0 Not Estab. %   Immature Grans (Abs) 0.0 0.0 - 0.1 [id number]  Basic  metabolic panel  Result Value Ref Range   Glucose 106 (H) 70 - 99 mg/dL   BUN 11 6 - 24 mg/dL   Creatinine, Ser 0.86 0.57 - 1.00 mg/dL   eGFR 80 >59 mL/min/1.73   BUN/Creatinine Ratio 13 9 - 23   Sodium 139 134 - 144 mmol/L   Potassium 4.7 3.5 - 5.2 mmol/L   Chloride 101 96 - 106 mmol/L   CO2 23 20 - 29 mmol/L   Calcium 9.9 8.7 - 10.2 mg/dL  TSH  Result  Value Ref Range   TSH 2.590 0.450 - 4.500 uIU/mL  Hepatitis C antibody  Result Value Ref Range   Hep C Virus Ab Non Reactive Non Reactive  Iron Binding Cap (TIBC)([facility])  Result Value Ref Range   Total Iron Binding Capacity 296 250 - 450 ug/dL   UIBC 240 131 - 425 ug/dL   Iron 56 27 - 159 ug/dL   Iron Saturation 19 15 - 55 %  Ferritin  Result Value Ref Range   Ferritin 47 15 - 150 ng/mL  Vitamin B12  Result Value Ref Range   Vitamin B-12 725 232 - 1,245 pg/mL      Assessment & Plan:   Problem List Items Addressed This Visit       Other   Depression, major, single episode, moderate    Chronic, ongoing with some improvement reported with increased Celexa dosing.  Denies SI/HI.  She is going to family therapy, which will offer benefit. Continue Celexa 20 MG daily, educated her on this and side effects that she is to notify provider of + increase Buspar to 10 MG TID dosing for anxiety.  Discussed BLACK BOX warning.  Return in 6 weeks for follow-up.      Relevant Medications   busPIRone (BUSPAR) 10 MG tablet   Situational anxiety - Primary    Refer to depression plan of care.      Relevant Medications   busPIRone (BUSPAR) 10 MG tablet    I discussed the assessment and treatment plan with the patient. The patient was provided an opportunity to ask questions and all were answered. The patient agreed with the plan and demonstrated an understanding of the instructions.   The patient was advised to call back or seek an in-person evaluation if the symptoms worsen or if the condition fails to improve as  anticipated.   I provided 21+ minutes of time during this encounter.    Follow up plan: Return in about 6 weeks (around 05/18/2022) for Anxiety -- buspar increased to 10 MG TID.

## 2022-04-06 NOTE — Assessment & Plan Note (Signed)
Chronic, ongoing with some improvement reported with increased Celexa dosing.  Denies SI/HI.  She is going to family therapy, which will offer benefit. Continue Celexa 20 MG daily, educated her on this and side effects that she is to notify provider of + increase Buspar to 10 MG TID dosing for anxiety.  Discussed BLACK BOX warning.  Return in 6 weeks for follow-up.

## 2022-04-06 NOTE — Assessment & Plan Note (Signed)
Refer to depression plan of care. 

## 2022-04-06 NOTE — Progress Notes (Signed)
Pt was called and scheduled for 05/18/2022 @ 1:40 w/Jolene

## 2022-04-28 ENCOUNTER — Other Ambulatory Visit: Payer: Self-pay | Admitting: Obstetrics and Gynecology

## 2022-04-28 DIAGNOSIS — Z1231 Encounter for screening mammogram for malignant neoplasm of breast: Secondary | ICD-10-CM

## 2022-04-29 ENCOUNTER — Other Ambulatory Visit: Payer: Self-pay | Admitting: Nurse Practitioner

## 2022-04-29 NOTE — Telephone Encounter (Signed)
Requested medications are due for refill today.  no  Requested medications are on the active medications list.  yes  Last refill. 04/06/2022 #90 4 rf  Future visit scheduled.   yes  Notes to clinic.  Pt is requesting a 90 day supply.    Requested Prescriptions  Pending Prescriptions Disp Refills   busPIRone (BUSPAR) 10 MG tablet [Pharmacy Med Name: BUSPIRONE HCL 10 MG TABLET] 270 tablet 2    Sig: TAKE 1 TABLET BY MOUTH THREE TIMES A DAY     Psychiatry: Anxiolytics/Hypnotics - Non-controlled Passed - 04/29/2022  1:32 PM      Passed - Valid encounter within last 12 months    Recent Outpatient Visits           3 weeks ago Situational anxiety   [facility] [place], Erhard T, NP   2 months ago Depression, major, single episode, moderate (HCC)   [place] [facility] [place], Harvel T, NP   3 months ago Depression, major, single episode, moderate (HCC)   [place] [facility] [place], Dorie Rank, NP       Future Appointments             In 2 weeks Cannady, Dorie Rank, NP [place] [facility]

## 2022-05-18 ENCOUNTER — Ambulatory Visit: Payer: BC Managed Care – PPO | Admitting: Nurse Practitioner

## 2022-05-18 VITALS — BP 117/56 | HR 77 | Temp 98.6°F | Ht 63.74 in | Wt 128.7 lb

## 2022-05-18 DIAGNOSIS — F321 Major depressive disorder, single episode, moderate: Secondary | ICD-10-CM | POA: Diagnosis not present

## 2022-05-18 DIAGNOSIS — F418 Other specified anxiety disorders: Secondary | ICD-10-CM

## 2022-05-18 MED ORDER — CITALOPRAM HYDROBROMIDE 40 MG PO TABS: 40.0000 mg | ORAL_TABLET | Freq: Every day | ORAL | 3 refills | Status: DC

## 2022-05-18 NOTE — Patient Instructions (Signed)
Managing Anxiety, Adult After being diagnosed with anxiety, you may be relieved to know why you have felt or behaved a certain way. You may also feel overwhelmed about the treatment ahead and what it will mean for your life. With care and support, you can manage your anxiety. How to manage lifestyle changes Understanding the difference between stress and anxiety Although stress can play a role in anxiety, it is not the same as anxiety. Stress is your body's reaction to life changes and events, both good and bad. Stress is often caused by something external, such as a deadline, test, or competition. It normally goes away after the event has ended and will last just a few hours. But, stress can be ongoing and can lead to more than just stress. Anxiety is caused by something internal, such as imagining a terrible outcome or worrying that something will go wrong that will greatly upset you. Anxiety often does not go away even after the event is over, and it can become a long-term (chronic) worry. Lowering stress and anxiety Talk with your health care provider or a counselor to learn more about lowering anxiety and stress. They may suggest tension-reduction techniques, such as: Music. Spend time creating or listening to music that you enjoy and that inspires you. Mindfulness-based meditation. Practice being aware of your normal breaths while not trying to control your breathing. It can be done while sitting or walking. Centering prayer. Focus on a word, phrase, or sacred image that means something to you and brings you peace. Deep breathing. Expand your stomach and inhale slowly through your nose. Hold your breath for 3-5 seconds. Then breathe out slowly, letting your stomach muscles relax. Self-talk. Learn to notice and spot thought patterns that lead to anxiety reactions. Change those patterns to thoughts that feel peaceful. Muscle relaxation. Take time to tense muscles and then relax them. Choose a  tension-reduction technique that fits your lifestyle and personality. These techniques take time and practice. Set aside 5-15 minutes a day to do them. Specialized therapists can offer counseling and training in these techniques. The training to help with anxiety may be covered by some insurance plans. Other things you can do to manage stress and anxiety include: Keeping a stress diary. This can help you learn what triggers your reaction and then learn ways to manage your response. Thinking about how you react to certain situations. You may not be able to control everything, but you can control your response. Making time for activities that help you relax and not feeling guilty about spending your time in this way. Doing visual imagery. This involves imagining or creating mental pictures to help you relax. Practicing yoga. Through yoga poses, you can lower tension and relax.  Medicines Medicines for anxiety include: Antidepressant medicines. These are usually prescribed for long-term daily control. Anti-anxiety medicines. These may be added in severe cases, especially when panic attacks occur. When used together, medicines, psychotherapy, and tension-reduction techniques may be the most effective treatment. Relationships Relationships can play a big part in helping you recover. Spend more time connecting with trusted friends and family members. Think about going to couples counseling if you have a partner, taking family education classes, or going to family therapy. Therapy can help you and others better understand your anxiety. How to recognize changes in your anxiety Everyone responds differently to treatment for anxiety. Recovery from anxiety happens when symptoms lessen and stop interfering with your daily life at home or work. This may mean that you   will start to: Have better concentration and focus. Worry will interfere less in your daily thinking. Sleep better. Be less irritable. Have more  energy. Have improved memory. Try to recognize when your condition is getting worse. Contact your provider if your symptoms interfere with home or work and you feel like your condition is not improving. Follow these instructions at home: Activity Exercise. Adults should: Exercise for at least 150 minutes each week. The exercise should increase your heart rate and make you sweat (moderate-intensity exercise). Do strengthening exercises at least twice a week. Get the right amount and quality of sleep. Most adults need 7-9 hours of sleep each night. Lifestyle  Eat a healthy diet that includes plenty of vegetables, fruits, whole grains, low-fat dairy products, and lean protein. Do not eat a lot of foods that are high in fats, added sugars, or salt (sodium). Make choices that simplify your life. Do not use any products that contain nicotine or tobacco. These products include cigarettes, chewing tobacco, and vaping devices, such as e-cigarettes. If you need help quitting, ask your provider. Avoid caffeine, alcohol, and certain over-the-counter cold medicines. These may make you feel worse. Ask your pharmacist which medicines to avoid. General instructions Take over-the-counter and prescription medicines only as told by your provider. Keep all follow-up visits. This is to make sure you are managing your anxiety well or if you need more support. Where to find support You can get help and support from: Self-help groups. Online and community organizations. A trusted spiritual leader. Couples counseling. Family education classes. Family therapy. Where to find more information You may find that joining a support group helps you deal with your anxiety. The following sources can help you find counselors or support groups near you: Mental Health America: mentalhealthamerica.net Anxiety and Depression Association of America (ADAA): adaa.org National Alliance on Mental Illness (NAMI): nami.org Contact  a health care provider if: You have a hard time staying focused or finishing tasks. You spend many hours a day feeling worried about everyday life. You are very tired because you cannot stop worrying. You start to have headaches or often feel tense. You have chronic nausea or diarrhea. Get help right away if: Your heart feels like it is racing. You have shortness of breath. You have thoughts of hurting yourself or others. Get help right away if you feel like you may hurt yourself or others, or have thoughts about taking your own life. Go to your nearest emergency room or: Call 911. Call the National Suicide Prevention Lifeline at 1-800-273-8255 or 988. This is open 24 hours a day. Text the Crisis Text Line at 741741. This information is not intended to replace advice given to you by your health care provider. Make sure you discuss any questions you have with your health care provider. Document Revised: 09/29/2021 Document Reviewed: 04/13/2020 Elsevier Patient Education  2023 Elsevier Inc.  

## 2022-05-18 NOTE — Assessment & Plan Note (Signed)
Refer to depression plan of care. 

## 2022-05-18 NOTE — Assessment & Plan Note (Signed)
Chronic, ongoing.  Having more good then bad days, but still not at goal.  Denies SI/HI.  She is going to virtual therapy, continue this. Trial increase in Celexa to 40 MG daily, educated her on this and side effects that she is to notify provider of + continue Buspar 10 MG TID dosing for anxiety.  Discussed BLACK BOX warning.  Return in 4 weeks for follow-up.

## 2022-05-18 NOTE — Progress Notes (Signed)
BP (!) 117/56   Pulse 77   Temp 98.6 F (37 C) (Oral)   Ht 5' 3.74" (1.619 m)   Wt 128 lb 11.2 oz (58.4 kg)   LMP 06/05/2018 (Approximate)   SpO2 100%   BMI 22.27 kg/m    Subjective:    Patient ID: Carmen George, female    DOB: 1965/03/17, 57 y.o.   MRN: [id number]  HPI: Carmen George is a 57 y.o. female  Chief Complaint  Patient presents with   Anxiety    Increased buspar to 10mg  TID prn, patient states that she is taking TID every day.   ANXIETY/STRESS Continues on Celexa 20 MG daily and at last visit increased Buspar to 10 MG TID PRN, she is taking this consistently three times a day.  She reports noticing a difference increasing Buspar TID, less anxiety.  Started Melatonin for sleep, but has noticed no benefit with this.  At this time does feel having more good days then bad days.  Continues to have visits with therapist virtually every other week.   Duration:stable Anxious mood:  occasional   Excessive worrying: yes Irritability: yes , occasional in evening if had a long day Sweating: no Nausea: no Palpitations:no Hyperventilation: no Panic attacks: no Agoraphobia: no  Obscessions/compulsions: no Depressed mood:  occasional , hard to get motivated for work    06/09/2022    3:26 PM 04/06/2022    1:54 PM 02/12/2022    9:37 AM 12/31/2021    8:29 AM  Depression screen PHQ 2/9  Decreased Interest 1 1 3 1   Down, Depressed, Hopeless 2 2 3 2   PHQ - 2 Score 3 3 6 3   Altered sleeping 3 3 2 3   Tired, decreased energy 2 1 2 1   Change in appetite 2 2 2 3   Feeling bad or failure about yourself  0 1 0 1  Trouble concentrating 2 1 1 1   Moving slowly or fidgety/restless 0 0 0 1  Suicidal thoughts 0 0 0 0  PHQ-9 Score 12 11 13 13   Difficult doing work/chores Somewhat difficult Somewhat difficult Somewhat difficult Somewhat difficult  Anhedonia: no Weight changes: no Insomnia: yes hard to fall asleep  Hypersomnia: no Fatigue/loss of energy: occasionally on  weekends Feelings of worthlessness: no Feelings of guilt: no Impaired concentration/indecisiveness: yes Suicidal ideations: no  Crying spells:  improved Recent Stressors/Life Changes: yes   Relationship problems: no   Family stress: yes     Financial stress: no    Job stress: yes    Recent death/loss: no     2022-06-09    3:26 PM 04/06/2022    1:58 PM 02/12/2022    9:37 AM 12/31/2021    8:29 AM  GAD 7 : Generalized Anxiety Score  Nervous, Anxious, on Edge 3 2 2 2   Control/stop worrying 2 2 2 2   Worry too much - different things 1 2 0 3  Trouble relaxing 1 2 0 2  Restless 0 3 0 1  Easily annoyed or irritable 2 3 2 2   Afraid - awful might happen 1 1 0 0  Total GAD 7 Score 10 15 6 12   Anxiety Difficulty Somewhat difficult Somewhat difficult Somewhat difficult Somewhat difficult   Relevant past medical, surgical, family and social history reviewed and updated as indicated. Interim medical history since our last visit reviewed. Allergies and medications reviewed and updated.  Review of Systems  Per HPI unless specifically indicated above     Objective:  BP (!) 117/56   Pulse 77   Temp 98.6 F (37 C) (Oral)   Ht 5' 3.74" (1.619 m)   Wt 128 lb 11.2 oz (58.4 kg)   LMP 06/05/2018 (Approximate)   SpO2 100%   BMI 22.27 kg/m   Wt Readings from Last 3 Encounters:  05/18/22 128 lb 11.2 oz (58.4 kg)  02/12/22 124 lb 1.6 oz (56.3 kg)  12/31/21 125 lb 3.2 oz (56.8 kg)    Physical Exam  Results for orders placed or performed in visit on 12/31/21  CBC with Differential/Platelet  Result Value Ref Range   WBC 6.2 3.4 - 10.8 x10E3/uL   RBC 4.35 3.77 - 5.28 x10E6/uL   Hemoglobin 13.4 11.1 - 15.9 g/dL   Hematocrit 16.1 09.6 - 46.6 %   MCV 95 79 - 97 fL   MCH 30.8 26.6 - 33.0 pg   MCHC 32.4 31.5 - 35.7 g/dL   RDW 04.5 40.9 - 81.1 %   Platelets 229 150 - 450 x10E3/uL   Neutrophils 72 Not Estab. %   Lymphs 20 Not Estab. %   Monocytes 6 Not Estab. %   Eos 1 Not Estab. %    Basos 1 Not Estab. %   Neutrophils Absolute 4.5 1.4 - 7.0 x10E3/uL   Lymphocytes Absolute 1.3 0.7 - 3.1 x10E3/uL   Monocytes Absolute 0.4 0.1 - 0.9 x10E3/uL   EOS (ABSOLUTE) 0.1 0.0 - 0.4 x10E3/uL   Basophils Absolute 0.0 0.0 - 0.2 x10E3/uL   Immature Granulocytes 0 Not Estab. %   Immature Grans (Abs) 0.0 0.0 - 0.1 x10E3/uL  Basic metabolic panel  Result Value Ref Range   Glucose 106 (H) 70 - 99 mg/dL   BUN 11 6 - 24 mg/dL   Creatinine, Ser 9.14 0.57 - 1.00 mg/dL   eGFR 80 >78 GN/FAO/1.30   BUN/Creatinine Ratio 13 9 - 23   Sodium 139 134 - 144 mmol/L   Potassium 4.7 3.5 - 5.2 mmol/L   Chloride 101 96 - 106 mmol/L   CO2 23 20 - 29 mmol/L   Calcium 9.9 8.7 - 10.2 mg/dL  TSH  Result Value Ref Range   TSH 2.590 0.450 - 4.500 uIU/mL  Hepatitis C antibody  Result Value Ref Range   Hep C Virus Ab Non Reactive Non Reactive  Iron Binding Cap (TIBC)([facility])  Result Value Ref Range   Total Iron Binding Capacity 296 250 - 450 ug/dL   UIBC 865 784 - 696 ug/dL   Iron 56 27 - 295 ug/dL   Iron Saturation 19 15 - 55 %  Ferritin  Result Value Ref Range   Ferritin 47 15 - 150 ng/mL  Vitamin B12  Result Value Ref Range   Vitamin B-12 725 232 - 1,245 pg/mL      Assessment & Plan:   Problem List Items Addressed This Visit       Other   Depression, major, single episode, moderate (HCC) - Primary    Chronic, ongoing.  Having more good then bad days, but still not at goal.  Denies SI/HI.  She is going to virtual therapy, continue this. Trial increase in Celexa to 40 MG daily, educated her on this and side effects that she is to notify provider of + continue Buspar 10 MG TID dosing for anxiety.  Discussed BLACK BOX warning.  Return in 4 weeks for follow-up.      Relevant Medications   citalopram (CELEXA) 40 MG tablet   Situational anxiety  Refer to depression plan of care.      Relevant Medications   citalopram (CELEXA) 40 MG tablet     Follow up plan: Return in about 4  weeks (around 06/15/2022) for ANXIETY AND DEPRESSION -- virtual visit.

## 2022-06-02 ENCOUNTER — Ambulatory Visit
Admission: RE | Admit: 2022-06-02 | Discharge: 2022-06-02 | Disposition: A | Discharge status: Home / Self Care | Payer: BC Managed Care – PPO | Source: Ambulatory Visit | Attending: Obstetrics and Gynecology | Admitting: Obstetrics and Gynecology

## 2022-06-02 DIAGNOSIS — Z1231 Encounter for screening mammogram for malignant neoplasm of breast: Secondary | ICD-10-CM | POA: Diagnosis present

## 2022-06-10 ENCOUNTER — Other Ambulatory Visit: Payer: Self-pay | Admitting: Nurse Practitioner

## 2022-06-10 NOTE — Telephone Encounter (Signed)
Requested medication (s) are due for refill today:   Requested medication (s) are on the active medication list: Yes  Last refill:  05/18/22  Future visit scheduled: Yes  Notes to clinic:  Pharmacy requests 90 day supply     Requested Prescriptions  Pending Prescriptions Disp Refills   citalopram (CELEXA) 40 MG tablet [Pharmacy Med Name: CITALOPRAM HBR 40 MG TABLET] 90 tablet 2    Sig: TAKE 1 TABLET BY MOUTH EVERY DAY     Psychiatry:  Antidepressants - SSRI Passed - 06/10/2022 11:32 AM      Passed - Completed PHQ-2 or PHQ-9 in the last 360 days      Passed - Valid encounter within last 6 months    Recent Outpatient Visits           3 weeks ago Depression, major, single episode, moderate (HCC)   [place] [facility] [place], [place] T, NP   2 months ago Situational anxiety   [place] [facility] [place], [place] T, NP   3 months ago Depression, major, single episode, moderate (HCC)   [place] [facility] [place], [place] T, NP   5 months ago Depression, major, single episode, moderate (HCC)   [place] [facility] [place], Dorie Rank, NP       Future Appointments             In 6 days Cannady, Dorie Rank, NP [facility]

## 2022-06-16 ENCOUNTER — Telehealth: Payer: BC Managed Care – PPO | Admitting: Nurse Practitioner

## 2022-07-03 NOTE — Patient Instructions (Signed)
Managing Anxiety, Adult After being diagnosed with anxiety, you may be relieved to know why you have felt or behaved a certain way. You may also feel overwhelmed about the treatment ahead and what it will mean for your life. With care and support, you can manage your anxiety. How to manage lifestyle changes Understanding the difference between stress and anxiety Although stress can play a role in anxiety, it is not the same as anxiety. Stress is your body's reaction to life changes and events, both good and bad. Stress is often caused by something external, such as a deadline, test, or competition. It normally goes away after the event has ended and will last just a few hours. But, stress can be ongoing and can lead to more than just stress. Anxiety is caused by something internal, such as imagining a terrible outcome or worrying that something will go wrong that will greatly upset you. Anxiety often does not go away even after the event is over, and it can become a long-term (chronic) worry. Lowering stress and anxiety Talk with your health care provider or a counselor to learn more about lowering anxiety and stress. They may suggest tension-reduction techniques, such as: Music. Spend time creating or listening to music that you enjoy and that inspires you. Mindfulness-based meditation. Practice being aware of your normal breaths while not trying to control your breathing. It can be done while sitting or walking. Centering prayer. Focus on a word, phrase, or sacred image that means something to you and brings you peace. Deep breathing. Expand your stomach and inhale slowly through your nose. Hold your breath for 3-5 seconds. Then breathe out slowly, letting your stomach muscles relax. Self-talk. Learn to notice and spot thought patterns that lead to anxiety reactions. Change those patterns to thoughts that feel peaceful. Muscle relaxation. Take time to tense muscles and then relax them. Choose a  tension-reduction technique that fits your lifestyle and personality. These techniques take time and practice. Set aside 5-15 minutes a day to do them. Specialized therapists can offer counseling and training in these techniques. The training to help with anxiety may be covered by some insurance plans. Other things you can do to manage stress and anxiety include: Keeping a stress diary. This can help you learn what triggers your reaction and then learn ways to manage your response. Thinking about how you react to certain situations. You may not be able to control everything, but you can control your response. Making time for activities that help you relax and not feeling guilty about spending your time in this way. Doing visual imagery. This involves imagining or creating mental pictures to help you relax. Practicing yoga. Through yoga poses, you can lower tension and relax.  Medicines Medicines for anxiety include: Antidepressant medicines. These are usually prescribed for long-term daily control. Anti-anxiety medicines. These may be added in severe cases, especially when panic attacks occur. When used together, medicines, psychotherapy, and tension-reduction techniques may be the most effective treatment. Relationships Relationships can play a big part in helping you recover. Spend more time connecting with trusted friends and family members. Think about going to couples counseling if you have a partner, taking family education classes, or going to family therapy. Therapy can help you and others better understand your anxiety. How to recognize changes in your anxiety Everyone responds differently to treatment for anxiety. Recovery from anxiety happens when symptoms lessen and stop interfering with your daily life at home or work. This may mean that you   will start to: Have better concentration and focus. Worry will interfere less in your daily thinking. Sleep better. Be less irritable. Have more  energy. Have improved memory. Try to recognize when your condition is getting worse. Contact your provider if your symptoms interfere with home or work and you feel like your condition is not improving. Follow these instructions at home: Activity Exercise. Adults should: Exercise for at least 150 minutes each week. The exercise should increase your heart rate and make you sweat (moderate-intensity exercise). Do strengthening exercises at least twice a week. Get the right amount and quality of sleep. Most adults need 7-9 hours of sleep each night. Lifestyle  Eat a healthy diet that includes plenty of vegetables, fruits, whole grains, low-fat dairy products, and lean protein. Do not eat a lot of foods that are high in fats, added sugars, or salt (sodium). Make choices that simplify your life. Do not use any products that contain nicotine or tobacco. These products include cigarettes, chewing tobacco, and vaping devices, such as e-cigarettes. If you need help quitting, ask your provider. Avoid caffeine, alcohol, and certain over-the-counter cold medicines. These may make you feel worse. Ask your pharmacist which medicines to avoid. General instructions Take over-the-counter and prescription medicines only as told by your provider. Keep all follow-up visits. This is to make sure you are managing your anxiety well or if you need more support. Where to find support You can get help and support from: Self-help groups. Online and community organizations. A trusted spiritual leader. Couples counseling. Family education classes. Family therapy. Where to find more information You may find that joining a support group helps you deal with your anxiety. The following sources can help you find counselors or support groups near you: Mental Health America: mentalhealthamerica.net Anxiety and Depression Association of America (ADAA): adaa.org National Alliance on Mental Illness (NAMI): nami.org Contact  a health care provider if: You have a hard time staying focused or finishing tasks. You spend many hours a day feeling worried about everyday life. You are very tired because you cannot stop worrying. You start to have headaches or often feel tense. You have chronic nausea or diarrhea. Get help right away if: Your heart feels like it is racing. You have shortness of breath. You have thoughts of hurting yourself or others. Get help right away if you feel like you may hurt yourself or others, or have thoughts about taking your own life. Go to your nearest emergency room or: Call 911. Call the National Suicide Prevention Lifeline at 1-800-273-8255 or 988. This is open 24 hours a day. Text the Crisis Text Line at 741741. This information is not intended to replace advice given to you by your health care provider. Make sure you discuss any questions you have with your health care provider. Document Revised: 09/29/2021 Document Reviewed: 04/13/2020 Elsevier Patient Education  2024 Elsevier Inc.  

## 2022-07-06 ENCOUNTER — Encounter: Payer: Self-pay | Admitting: Nurse Practitioner

## 2022-07-06 ENCOUNTER — Telehealth: Payer: BC Managed Care – PPO | Admitting: Nurse Practitioner

## 2022-07-06 DIAGNOSIS — F321 Major depressive disorder, single episode, moderate: Secondary | ICD-10-CM | POA: Diagnosis not present

## 2022-07-06 DIAGNOSIS — F418 Other specified anxiety disorders: Secondary | ICD-10-CM

## 2022-07-06 NOTE — Assessment & Plan Note (Signed)
Chronic, ongoing - is improving with increase in Celexa.  Having more good then bad days.  Denies SI/HI.  She is going to virtual therapy, continue this. Continue Celexa 40 MG daily, educated her on this and side effects that she is to notify provider of + continue Buspar 10 MG TID dosing for anxiety.  Discussed BLACK BOX warning.  Could consider addition of Wellbutrin in future, while maintaining Celexa for anxiety.  Wellbutrin may improve motivation and depressive symptoms.  Return in 6 months.

## 2022-07-06 NOTE — Progress Notes (Signed)
LMP 06/05/2018 (Approximate)    Subjective:    Patient ID: Carmen George, female    DOB: 09/06/1965, 57 y.o.   MRN: [id number]  HPI: Carmen George is a 57 y.o. female  Chief Complaint  Patient presents with   Anxiety   Depression   This visit was completed via video visit through MyChart due to the restrictions of the COVID-19 pandemic. All issues as above were discussed and addressed. Physical exam was done as above through visual confirmation on video through MyChart. If it was felt that the patient should be evaluated in the office, they were directed there. The patient verbally consented to this visit. Location of the patient: home Location of the provider: work Those involved with this call:  Provider: Aura Dials, DNP CMA: Tristan Schroeder, CMA Front Desk/Registration: [employer]  Time spent on call:  21 minutes with patient face to face via video conference. More than 50% of this time was spent in counseling and coordination of care. 15 minutes total spent in review of patient's record and preparation of their chart.  I verified patient identity using two factors (patient name and date of birth). Patient consents verbally to being seen via telemedicine visit today.    ANXIETY/STRESS Follow-up for medication changes recent visit on 2022-06-11 -- Celexa to 40 MG daily + Buspar 10 MG TID.  She reports feeling better, having more good days then bad days.  Continues to have visits with therapist virtually every other week.   Duration:stable Anxious mood:  occasional   Excessive worrying: depends on the day Irritability: yes , occasional Sweating: no Nausea: no Palpitations:no Hyperventilation: no Panic attacks: no Agoraphobia: no  Obscessions/compulsions: no Depressed mood:  occasional  -- improving    07/06/2022    1:36 PM Jun 11, 2022    3:26 PM 04/06/2022    1:54 PM 02/12/2022    9:37 AM 12/31/2021    8:29 AM  Depression screen PHQ 2/9  Decreased Interest 2 1 1 3 1    Down, Depressed, Hopeless 1 2 2 3 2   PHQ - 2 Score 3 3 3 6 3   Altered sleeping 3 3 3 2 3   Tired, decreased energy 2 2 1 2 1   Change in appetite 2 2 2 2 3   Feeling bad or failure about yourself  1 0 1 0 1  Trouble concentrating 2 2 1 1 1   Moving slowly or fidgety/restless 0 0 0 0 1  Suicidal thoughts 0 0 0 0 0  PHQ-9 Score 13 12 11 13 13   Difficult doing work/chores Somewhat difficult Somewhat difficult Somewhat difficult Somewhat difficult Somewhat difficult  Anhedonia: no Weight changes: no Insomnia: yes hard to fall asleep  Hypersomnia: no Fatigue/loss of energy: occasionally Feelings of worthlessness: no Feelings of guilt: no Impaired concentration/indecisiveness: yes, occasionally Suicidal ideations: no  Crying spells:  improved Recent Stressors/Life Changes: yes   Relationship problems: no   Family stress: yes     Financial stress: no    Job stress: yes    Recent death/loss: no     06/11/22    3:26 PM 04/06/2022    1:58 PM 02/12/2022    9:37 AM 12/31/2021    8:29 AM  GAD 7 : Generalized Anxiety Score  Nervous, Anxious, on Edge 3 2 2 2   Control/stop worrying 2 2 2 2   Worry too much - different things 1 2 0 3  Trouble relaxing 1 2 0 2  Restless 0 3 0 1  Easily  annoyed or irritable 2 3 2 2   Afraid - awful might happen 1 1 0 0  Total GAD 7 Score 10 15 6 12   Anxiety Difficulty Somewhat difficult Somewhat difficult Somewhat difficult Somewhat difficult   Relevant past medical, surgical, family and social history reviewed and updated as indicated. Interim medical history since our last visit reviewed. Allergies and medications reviewed and updated.  Review of Systems  Constitutional:  Negative for activity change, appetite change, diaphoresis, fatigue and fever.  Respiratory:  Negative for cough, chest tightness and shortness of breath.   Cardiovascular:  Negative for chest pain, palpitations and leg swelling.  Gastrointestinal: Negative.   Endocrine: Negative for  cold intolerance, heat intolerance, polydipsia, polyphagia and polyuria.  Neurological:  Negative for dizziness, syncope, weakness, light-headedness, numbness and headaches.  Psychiatric/Behavioral:  Positive for sleep disturbance. Negative for decreased concentration, self-injury and suicidal ideas. The patient is nervous/anxious.     Per HPI unless specifically indicated above     Objective:    LMP 06/05/2018 (Approximate)   Wt Readings from Last 3 Encounters:  05/18/22 128 lb 11.2 oz (58.4 kg)  02/12/22 124 lb 1.6 oz (56.3 kg)  12/31/21 125 lb 3.2 oz (56.8 kg)    Physical Exam Vitals and nursing note reviewed.  Constitutional:      General: She is awake. She is not in acute distress.    Appearance: She is well-developed. She is not ill-appearing.  HENT:     Head: Normocephalic.     Right Ear: Hearing normal.     Left Ear: Hearing normal.  Eyes:     General: Lids are normal.        Right eye: No discharge.        Left eye: No discharge.     Conjunctiva/sclera: Conjunctivae normal.  Pulmonary:     Effort: Pulmonary effort is normal. No accessory muscle usage or respiratory distress.  Musculoskeletal:     Cervical back: Normal range of motion.  Neurological:     Mental Status: She is alert and oriented to person, place, and time.  Psychiatric:        Attention and Perception: Attention normal.        Mood and Affect: Mood normal.        Behavior: Behavior normal. Behavior is cooperative.        Thought Content: Thought content normal.        Judgment: Judgment normal.    Results for orders placed or performed in visit on 12/31/21  CBC with Differential/Platelet  Result Value Ref Range   WBC 6.2 3.4 - 10.8 x10E3/uL   RBC 4.35 3.77 - 5.28 x10E6/uL   Hemoglobin 13.4 11.1 - 15.9 g/dL   Hematocrit 16.1 09.6 - 46.6 %   MCV 95 79 - 97 fL   MCH 30.8 26.6 - 33.0 pg   MCHC 32.4 31.5 - 35.7 g/dL   RDW 04.5 40.9 - 81.1 %   Platelets 229 150 - 450 x10E3/uL   Neutrophils 72  Not Estab. %   Lymphs 20 Not Estab. %   Monocytes 6 Not Estab. %   Eos 1 Not Estab. %   Basos 1 Not Estab. %   Neutrophils Absolute 4.5 1.4 - 7.0 x10E3/uL   Lymphocytes Absolute 1.3 0.7 - 3.1 x10E3/uL   Monocytes Absolute 0.4 0.1 - 0.9 x10E3/uL   EOS (ABSOLUTE) 0.1 0.0 - 0.4 x10E3/uL   Basophils Absolute 0.0 0.0 - 0.2 x10E3/uL   Immature Granulocytes 0 Not Estab. %  Immature Grans (Abs) 0.0 0.0 - 0.1 x10E3/uL  Basic metabolic panel  Result Value Ref Range   Glucose 106 (H) 70 - 99 mg/dL   BUN 11 6 - 24 mg/dL   Creatinine, Ser 1.61 0.57 - 1.00 mg/dL   eGFR 80 >09 UE/AVW/0.98   BUN/Creatinine Ratio 13 9 - 23   Sodium 139 134 - 144 mmol/L   Potassium 4.7 3.5 - 5.2 mmol/L   Chloride 101 96 - 106 mmol/L   CO2 23 20 - 29 mmol/L   Calcium 9.9 8.7 - 10.2 mg/dL  TSH  Result Value Ref Range   TSH 2.590 0.450 - 4.500 uIU/mL  Hepatitis C antibody  Result Value Ref Range   Hep C Virus Ab Non Reactive Non Reactive  Iron Binding Cap (TIBC)([facility])  Result Value Ref Range   Total Iron Binding Capacity 296 250 - 450 ug/dL   UIBC 119 147 - 829 ug/dL   Iron 56 27 - 562 ug/dL   Iron Saturation 19 15 - 55 %  Ferritin  Result Value Ref Range   Ferritin 47 15 - 150 ng/mL  Vitamin B12  Result Value Ref Range   Vitamin B-12 725 232 - 1,245 pg/mL      Assessment & Plan:   Problem List Items Addressed This Visit       Other   Depression, major, single episode, moderate (HCC) - Primary    Chronic, ongoing - is improving with increase in Celexa.  Having more good then bad days.  Denies SI/HI.  She is going to virtual therapy, continue this. Continue Celexa 40 MG daily, educated her on this and side effects that she is to notify provider of + continue Buspar 10 MG TID dosing for anxiety.  Discussed BLACK BOX warning.  Could consider addition of Wellbutrin in future, while maintaining Celexa for anxiety.  Wellbutrin may improve motivation and depressive symptoms.  Return in 6 months.       Situational anxiety    Refer to depression plan of care.       I discussed the assessment and treatment plan with the patient. The patient was provided an opportunity to ask questions and all were answered. The patient agreed with the plan and demonstrated an understanding of the instructions.   The patient was advised to call back or seek an in-person evaluation if the symptoms worsen or if the condition fails to improve as anticipated.   I provided 21+ minutes of time during this encounter.    Follow up plan: Return in about 6 months (around 01/06/2023) for Annual physical after 01/01/23.

## 2022-07-06 NOTE — Progress Notes (Signed)
Called patient  and left message for her to call the office and schedule an appointment for a physical after 12/28

## 2022-07-06 NOTE — Assessment & Plan Note (Signed)
Refer to depression plan of care. 

## 2022-09-07 ENCOUNTER — Other Ambulatory Visit: Payer: Self-pay | Admitting: Medical Genetics

## 2022-09-07 DIAGNOSIS — Z006 Encounter for examination for normal comparison and control in clinical research program: Secondary | ICD-10-CM

## 2022-10-27 ENCOUNTER — Other Ambulatory Visit: Payer: BC Managed Care – PPO

## 2022-11-01 ENCOUNTER — Other Ambulatory Visit: Payer: BC Managed Care – PPO

## 2022-11-10 ENCOUNTER — Encounter: Payer: BC Managed Care – PPO | Admitting: Nurse Practitioner

## 2022-11-10 ENCOUNTER — Other Ambulatory Visit: Payer: BC Managed Care – PPO

## 2022-11-15 ENCOUNTER — Other Ambulatory Visit
Admission: RE | Admit: 2022-11-15 | Discharge: 2022-11-15 | Disposition: A | Discharge status: Home / Self Care | Payer: BC Managed Care – PPO | Source: Ambulatory Visit | Attending: Medical Genetics | Admitting: Medical Genetics

## 2022-11-15 DIAGNOSIS — Z006 Encounter for examination for normal comparison and control in clinical research program: Secondary | ICD-10-CM | POA: Insufficient documentation

## 2022-11-23 LAB — HELIX MOLECULAR SCREEN: Genetic Analysis Overall Interpretation: NEGATIVE

## 2022-11-23 LAB — GENECONNECT MOLECULAR SCREEN

## 2022-11-28 NOTE — Patient Instructions (Signed)
Be Involved in Caring For Your Health:  Taking Medications When medications are taken as directed, they can greatly improve your health. But if they are not taken as prescribed, they may not work. In some cases, not taking them correctly can be harmful. To help ensure your treatment remains effective and safe, understand your medications and how to take them. Bring your medications to each visit for review by your provider.  Your lab results, notes, and after visit summary will be available on My Chart. We strongly encourage you to use this feature. If lab results are abnormal the clinic will contact you with the appropriate steps. If the clinic does not contact you assume the results are satisfactory. You can always view your results on My Chart. If you have questions regarding your health or results, please contact the clinic during office hours. You can also ask questions on My Chart.  We at [facility] are grateful that you chose [place] to provide your care. We strive to provide evidence-based and compassionate care and are always looking for feedback. If you get a survey from the clinic please complete this so we can hear your opinions.  Healthy Eating, Adult Healthy eating may help you get and keep a healthy body weight, reduce the risk of chronic disease, and live a long and productive life. It is important to follow a healthy eating pattern. Your nutritional and calorie needs should be met mainly by different nutrient-rich foods. What are tips for following this plan? Reading food labels Read labels and choose the following: Reduced or low sodium products. Juices with 100% fruit juice. Foods with low saturated fats (<3 g per serving) and high polyunsaturated and monounsaturated fats. Foods with whole grains, such as whole wheat, cracked wheat, brown rice, and wild rice. Whole grains that are fortified with folic acid. This is recommended for females who are pregnant or who want to  become pregnant. Read labels and do not eat or drink the following: Foods or drinks with added sugars. These include foods that contain brown sugar, corn sweetener, corn syrup, dextrose, fructose, glucose, high-fructose corn syrup, honey, invert sugar, lactose, malt syrup, maltose, molasses, raw sugar, sucrose, trehalose, or turbinado sugar. Limit your intake of added sugars to less than 10% of your total daily calories. Do not eat more than the following amounts of added sugar per day: 6 teaspoons (25 g) for females. 9 teaspoons (38 g) for males. Foods that contain processed or refined starches and grains. Refined grain products, such as white flour, degermed cornmeal, white bread, and white rice. Shopping Choose nutrient-rich snacks, such as vegetables, whole fruits, and nuts. Avoid high-calorie and high-sugar snacks, such as potato chips, fruit snacks, and candy. Use oil-based dressings and spreads on foods instead of solid fats such as butter, margarine, sour cream, or cream cheese. Limit pre-made sauces, mixes, and "instant" products such as flavored rice, instant noodles, and ready-made pasta. Try more plant-protein sources, such as tofu, tempeh, black beans, edamame, lentils, nuts, and seeds. Explore eating plans such as the Mediterranean diet or vegetarian diet. Try heart-healthy dips made with beans and healthy fats like hummus and guacamole. Vegetables go great with these. Cooking Use oil to saut or stir-fry foods instead of solid fats such as butter, margarine, or lard. Try baking, boiling, grilling, or broiling instead of frying. Remove the fatty part of meats before cooking. Steam vegetables in water or broth. Meal planning  At meals, imagine dividing your plate into fourths: One-half of  your plate is fruits and vegetables. One-fourth of your plate is whole grains. One-fourth of your plate is protein, especially lean meats, poultry, eggs, tofu, beans, or nuts. Include low-fat  dairy as part of your daily diet. Lifestyle Choose healthy options in all settings, including home, work, school, restaurants, or stores. Prepare your food safely: Wash your hands after handling raw meats. Where you prepare food, keep surfaces clean by regularly washing with hot, soapy water. Keep raw meats separate from ready-to-eat foods, such as fruits and vegetables. Cook seafood, meat, poultry, and eggs to the recommended temperature. Get a food thermometer. Store foods at safe temperatures. In general: Keep cold foods at 48F (4.4C) or below. Keep hot foods at 148F (60C) or above. Keep your freezer at [facility] (-17.8C) or below. Foods are not safe to eat if they have been between the temperatures of 40-148F (4.4-60C) for more than 2 hours. What foods should I eat? Fruits Aim to eat 1-2 cups of fresh, canned (in natural juice), or frozen fruits each day. One cup of fruit equals 1 small apple, 1 large banana, 8 large strawberries, 1 cup (237 g) canned fruit,  cup (82 g) dried fruit, or 1 cup (240 mL) 100% juice. Vegetables Aim to eat 2-4 cups of fresh and frozen vegetables each day, including different varieties and colors. One cup of vegetables equals 1 cup (91 g) broccoli or cauliflower florets, 2 medium carrots, 2 cups (150 g) raw, leafy greens, 1 large tomato, 1 large bell pepper, 1 large sweet potato, or 1 medium white potato. Grains Aim to eat 5-10 ounce-equivalents of whole grains each day. Examples of 1 ounce-equivalent of grains include 1 slice of bread, 1 cup (40 g) ready-to-eat cereal, 3 cups (24 g) popcorn, or  cup (93 g) cooked rice. Meats and other proteins Try to eat 5-7 ounce-equivalents of protein each day. Examples of 1 ounce-equivalent of protein include 1 egg,  oz nuts (12 almonds, 24 pistachios, or 7 walnut halves), 1/4 cup (90 g) cooked beans, 6 tablespoons (90 g) hummus or 1 tablespoon (16 g) peanut butter. A cut of meat or fish that is the size of a deck of  cards is about 3-4 ounce-equivalents (85 g). Of the protein you eat each week, try to have at least 8 sounce (227 g) of seafood. This is about 2 servings per week. This includes salmon, trout, herring, sardines, and anchovies. Dairy Aim to eat 3 cup-equivalents of fat-free or low-fat dairy each day. Examples of 1 cup-equivalent of dairy include 1 cup (240 mL) milk, 8 ounces (250 g) yogurt, 1 ounces (44 g) natural cheese, or 1 cup (240 mL) fortified soy milk. Fats and oils Aim for about 5 teaspoons (21 g) of fats and oils per day. Choose monounsaturated fats, such as canola and olive oils, mayonnaise made with olive oil or avocado oil, avocados, peanut butter, and most nuts, or polyunsaturated fats, such as sunflower, corn, and soybean oils, walnuts, pine nuts, sesame seeds, sunflower seeds, and flaxseed. Beverages Aim for 6 eight-ounce glasses of water per day. Limit coffee to 3-5 eight-ounce cups per day. Limit caffeinated beverages that have added calories, such as soda and energy drinks. If you drink alcohol: Limit how much you have to: 0-1 drink a day if you are female. 0-2 drinks a day if you are female. Know how much alcohol is in your drink. In the [place] one drink is one 12 oz bottle of beer (355 mL), one 5 oz glass of wine (  148 mL), or one 1 oz glass of hard liquor (44 mL). Seasoning and other foods Try not to add too much salt to your food. Try using herbs and spices instead of salt. Try not to add sugar to food. This information is based on [place] nutrition guidelines. To learn more, visit [employer].be. Exact amounts may vary. You may need different amounts. This information is not intended to replace advice given to you by your health care provider. Make sure you discuss any questions you have with your health care provider. Document Revised: 09/21/2021 Document Reviewed: 09/21/2021 Elsevier Patient Education  2024 [employer].

## 2022-11-30 ENCOUNTER — Ambulatory Visit (INDEPENDENT_AMBULATORY_CARE_PROVIDER_SITE_OTHER): Payer: BC Managed Care – PPO | Admitting: Nurse Practitioner

## 2022-11-30 VITALS — BP 96/61 | HR 69 | Temp 98.6°F | Resp 18 | Ht 64.0 in | Wt 122.0 lb

## 2022-11-30 DIAGNOSIS — Z23 Encounter for immunization: Secondary | ICD-10-CM

## 2022-11-30 DIAGNOSIS — E7800 Pure hypercholesterolemia, unspecified: Secondary | ICD-10-CM

## 2022-11-30 DIAGNOSIS — F321 Major depressive disorder, single episode, moderate: Secondary | ICD-10-CM | POA: Diagnosis not present

## 2022-11-30 DIAGNOSIS — D649 Anemia, unspecified: Secondary | ICD-10-CM | POA: Diagnosis not present

## 2022-11-30 DIAGNOSIS — Z0000 Encounter for general adult medical examination without abnormal findings: Secondary | ICD-10-CM

## 2022-11-30 DIAGNOSIS — F418 Other specified anxiety disorders: Secondary | ICD-10-CM

## 2022-11-30 NOTE — Assessment & Plan Note (Signed)
Refer to depression plan of care. 

## 2022-11-30 NOTE — Assessment & Plan Note (Signed)
Noted on past labs, recheck today. Continue focus on healthy diet and regular exercise.

## 2022-11-30 NOTE — Assessment & Plan Note (Signed)
History of while donating blood, check CBC, iron, ferritin, and B12 today.  Continue supplement and adjust as needed.

## 2022-11-30 NOTE — Assessment & Plan Note (Signed)
Chronic, ongoing.  Having more good then bad days.  Denies SI/HI.  Overall she reports stable mood.  Continue Celexa 40 MG daily + continue Buspar 10 MG TID dosing for anxiety.  Discussed BLACK BOX warning.  Could consider addition of Wellbutrin in future, while maintaining Celexa for anxiety.  Wellbutrin may improve motivation and depressive symptoms.  Return in 6 months.

## 2022-11-30 NOTE — Progress Notes (Signed)
BP 96/61 (BP Location: Left Arm, Patient Position: Sitting, Cuff Size: Normal)   Pulse 69   Temp 98.6 F (37 C) (Oral)   Resp 18   Ht 5\' 4"  (1.626 m)   Wt 122 lb (55.3 kg)   LMP 06/05/2018 (Approximate)   SpO2 100%   BMI 20.94 kg/m    Subjective:    Patient ID: Carmen George, female    DOB: 1965-05-24, 57 y.o.   MRN: [id number]  HPI: FRIDAY BEGNOCHE is a 57 y.o. female presenting on Dec 19, 2022 for comprehensive medical examination. Current medical complaints include:none  She currently lives with: husband Menopausal Symptoms: no  ANXIETY/STRESS Currently taking Celexa 40 MG daily and Buspar 10 MG TID. She is not going to therapy at this time.  History of anemia, takes supplements daily. Duration:stable Anxious mood: yes  Excessive worrying:  sometimes Irritability: yes  Sweating: no Nausea: no Palpitations:no Hyperventilation: no Panic attacks: no Agoraphobia: no  Obscessions/compulsions: no Depressed mood:  occasional    19-Dec-2022    2:00 PM 07/06/2022    1:36 PM 05/18/2022    3:26 PM 04/06/2022    1:54 PM 02/12/2022    9:37 AM  Depression screen PHQ 2/9  Decreased Interest 1 2 1 1 3   Down, Depressed, Hopeless 1 1 2 2 3   PHQ - 2 Score 2 3 3 3 6   Altered sleeping 2 3 3 3 2   Tired, decreased energy 1 2 2 1 2   Change in appetite 1 2 2 2 2   Feeling bad or failure about yourself  1 1 0 1 0  Trouble concentrating 2 2 2 1 1   Moving slowly or fidgety/restless 0 0 0 0 0  Suicidal thoughts 0 0 0 0 0  PHQ-9 Score 9 13 12 11 13   Difficult doing work/chores Somewhat difficult Somewhat difficult Somewhat difficult Somewhat difficult Somewhat difficult  Anhedonia: no Weight changes: no Insomnia: yes hard to fall asleep -- tried Melatonin without benefit Hypersomnia: no Fatigue/loss of energy: no Feelings of worthlessness: no Feelings of guilt: no Impaired concentration/indecisiveness: yes Suicidal ideations: no  Crying spells: no Recent Stressors/Life Changes:  yes   Relationship problems: no   Family stress: yes     Financial stress: no    Job stress: no    Recent death/loss: no     Dec 19, 2022    2:00 PM 05/18/2022    3:26 PM 04/06/2022    1:58 PM 02/12/2022    9:37 AM  GAD 7 : Generalized Anxiety Score  Nervous, Anxious, on Edge 1 3 2 2   Control/stop worrying 2 2 2 2   Worry too much - different things 2 1 2  0  Trouble relaxing 1 1 2  0  Restless 0 0 3 0  Easily annoyed or irritable 2 2 3 2   Afraid - awful might happen 1 1 1  0  Total GAD 7 Score 9 10 15 6   Anxiety Difficulty Somewhat difficult Somewhat difficult Somewhat difficult Somewhat difficult      08/16/2019    8:14 AM 02/12/2022    9:37 AM 05/18/2022    3:26 PM 07/06/2022    1:36 PM 19-Dec-2022    1:59 PM  Fall Risk  Falls in the past year?  0 0 1 1  Was there an injury with Fall?  0 0  1  Fall Risk Category Calculator  0 0  2  (RETIRED) Patient Fall Risk Level Low fall risk      Patient at Risk  for Falls Due to  No Fall Risks No Fall Risks Impaired balance/gait;History of fall(s);Orthopedic patient History of fall(s)  Fall risk Follow up  Falls evaluation completed Falls evaluation completed Falls evaluation completed Education provided    Past Medical History:  Past Medical History:  Diagnosis Date   Chicken pox    Depression    Situational anxiety 12/31/2021   Wears contact lenses     Surgical History:  Past Surgical History:  Procedure Laterality Date   CESAREAN SECTION     x 3   COLONOSCOPY WITH PROPOFOL N/A 08/16/2019   Procedure: COLONOSCOPY WITH PROPOFOL;  Surgeon: Midge Minium, MD;  [facility];  Service: Endoscopy;  Laterality: N/A;  priority 4   TUBAL LIGATION      Medications:  Current Outpatient Medications on File Prior to Visit  Medication Sig   busPIRone (BUSPAR) 10 MG tablet TAKE 1 TABLET BY MOUTH THREE TIMES A DAY   citalopram (CELEXA) 40 MG tablet TAKE 1 TABLET BY MOUTH EVERY DAY   Coenzyme Q10 (COQ10 PO) Take by mouth daily.    magnesium 30 MG tablet Take 30 mg by mouth 2 (two) times daily.   Multiple Vitamin (MULTIVITAMIN) capsule Take 1 capsule by mouth daily.   Omega-3 Fatty Acids (FISH OIL CONCENTRATE) 1000 MG CAPS Take 1,000 mg by mouth daily at 12 noon.   Probiotic Product (PROBIOTIC PO) Take by mouth 3 (three) times daily.   TURMERIC PO Take by mouth daily.   No current facility-administered medications on file prior to visit.    Allergies:  No Known Allergies  Social History:  Social History   Socioeconomic History   Marital status: Married    Spouse name: Not on file   Number of children: Not on file   Years of education: Not on file   Highest education level: Professional school degree (e.g., MD, DDS, DVM, JD)  Occupational History   Not on file  Tobacco Use   Smoking status: Never   Smokeless tobacco: Never  Vaping Use   Vaping status: Never Used  Substance and Sexual Activity   Alcohol use: Yes    Alcohol/week: 3.0 standard drinks of alcohol    Types: 3 Standard drinks or equivalent per week    Comment: 3-4/week   Drug use: No   Sexual activity: Yes    Partners: Male  Other Topics Concern   Not on file  Social History Narrative   Not on file   Social Determinants of Health   Financial Resource Strain: Low Risk  (11/30/2022)   Overall Financial Resource Strain (CARDIA)    Difficulty of Paying Living Expenses: Not hard at all  Food Insecurity: No Food Insecurity (11/30/2022)   Hunger Vital Sign    Worried About Running Out of Food in the Last Year: Never true    Ran Out of Food in the Last Year: Never true  Transportation Needs: No Transportation Needs (11/30/2022)   PRAPARE - Administrator, Civil Service (Medical): No    Lack of Transportation (Non-Medical): No  Physical Activity: Sufficiently Active (11/30/2022)   Exercise Vital Sign    Days of Exercise per Week: 4 days    Minutes of Exercise per Session: 60 min  Stress: Stress Concern Present (11/30/2022)    [employer] of Occupational Health - Occupational Stress Questionnaire    Feeling of Stress : To some extent  Social Connections: Socially Integrated (11/30/2022)   Social Connection and Isolation Panel [NHANES]  Frequency of Communication with Friends and Family: More than three times a week    Frequency of Social Gatherings with Friends and Family: Once a week    Attends Religious Services: More than 4 times per year    Active Member of [employer] or Organizations: Yes    Attends Engineer, structural: More than 4 times per year    Marital Status: Married  Catering manager Violence: Not At Risk (12/31/2021)   Humiliation, Afraid, Rape, and Kick questionnaire    Fear of Current or Ex-Partner: No    Emotionally Abused: No    Physically Abused: No    Sexually Abused: No   Social History   Tobacco Use  Smoking Status Never  Smokeless Tobacco Never   Social History   Substance and Sexual Activity  Alcohol Use Yes   Alcohol/week: 3.0 standard drinks of alcohol   Types: 3 Standard drinks or equivalent per week   Comment: 3-4/week    Family History:  Family History  Problem Relation Age of Onset   Hyperlipidemia Mother    Hypertension Mother    Hypercholesterolemia Mother    Heart disease Father    Hypertension Father    Mental illness Father    Diabetes Father    Depression Maternal Grandmother    Depression Maternal Grandfather    Stroke Paternal Grandmother    Breast cancer Neg Hx     Past medical history, surgical history, medications, allergies, family history and social history reviewed with patient today and changes made to appropriate areas of the chart.   ROS All other ROS negative except what is listed above and in the HPI.      Objective:    BP 96/61 (BP Location: Left Arm, Patient Position: Sitting, Cuff Size: Normal)   Pulse 69   Temp 98.6 F (37 C) (Oral)   Resp 18   Ht 5\' 4"  (1.626 m)   Wt 122 lb (55.3 kg)   LMP 06/05/2018  (Approximate)   SpO2 100%   BMI 20.94 kg/m   Wt Readings from Last 3 Encounters:  11/30/22 122 lb (55.3 kg)  05/18/22 128 lb 11.2 oz (58.4 kg)  02/12/22 124 lb 1.6 oz (56.3 kg)    Physical Exam Vitals and nursing note reviewed. Exam conducted with a chaperone present.  Constitutional:      General: She is awake. She is not in acute distress.    Appearance: She is well-developed and well-groomed. She is not ill-appearing or toxic-appearing.  HENT:     Head: Normocephalic and atraumatic.     Right Ear: Hearing, tympanic membrane, ear canal and external ear normal. No drainage.     Left Ear: Hearing, tympanic membrane, ear canal and external ear normal. No drainage.     Nose: Nose normal.     Right Sinus: No maxillary sinus tenderness or frontal sinus tenderness.     Left Sinus: No maxillary sinus tenderness or frontal sinus tenderness.     Mouth/Throat:     Mouth: Mucous membranes are moist.     Pharynx: Oropharynx is clear. Uvula midline. No pharyngeal swelling, oropharyngeal exudate or posterior oropharyngeal erythema.  Eyes:     General: Lids are normal.        Right eye: No discharge.        Left eye: No discharge.     Extraocular Movements: Extraocular movements intact.     Conjunctiva/sclera: Conjunctivae normal.     Pupils: Pupils are equal, round, and reactive to  light.     Visual Fields: Right eye visual fields normal and left eye visual fields normal.  Neck:     Thyroid: No thyromegaly.     Vascular: No carotid bruit.     Trachea: Trachea normal.  Cardiovascular:     Rate and Rhythm: Normal rate and regular rhythm.     Heart sounds: Normal heart sounds. No murmur heard.    No gallop.  Pulmonary:     Effort: Pulmonary effort is normal. No accessory muscle usage or respiratory distress.     Breath sounds: Normal breath sounds.  Chest:     Comments: Deferred, sees GYN upcoming. Abdominal:     General: Bowel sounds are normal.     Palpations: Abdomen is soft. There  is no hepatomegaly or splenomegaly.     Tenderness: There is no abdominal tenderness.  Musculoskeletal:        General: Normal range of motion.     Cervical back: Normal range of motion and neck supple.     Right lower leg: No edema.     Left lower leg: No edema.  Lymphadenopathy:     Head:     Right side of head: No submental, submandibular, tonsillar, preauricular or posterior auricular adenopathy.     Left side of head: No submental, submandibular, tonsillar, preauricular or posterior auricular adenopathy.     Cervical: No cervical adenopathy.  Skin:    General: Skin is warm and dry.     Capillary Refill: Capillary refill takes less than 2 seconds.     Findings: No rash.  Neurological:     Mental Status: She is alert and oriented to person, place, and time.     Gait: Gait is intact.     Deep Tendon Reflexes: Reflexes are normal and symmetric.     Reflex Scores:      Brachioradialis reflexes are 2+ on the right side and 2+ on the left side.      Patellar reflexes are 2+ on the right side and 2+ on the left side. Psychiatric:        Attention and Perception: Attention normal.        Mood and Affect: Mood normal.        Speech: Speech normal.        Behavior: Behavior normal. Behavior is cooperative.        Thought Content: Thought content normal.        Judgment: Judgment normal.    Results for orders placed or performed during the hospital encounter of 11/15/22  [facility] Molecular Screen- Blood ([facility])  Result Value Ref Range   Genetic Analysis Overall Interpretation Negative    Genetic Disease Assessed      Hel[facility] Tier One Population Screen is a screening test that analyzes 11 genes related to hereditary breast and ovarian cancer (HBOC) syndrome, Lynch syndrome, and familial hypercholesterolemia. This test only reports clinically significant pathogenic and  likely pathogenic variants but does not report variants of uncertain significance (VUS). In addition,  analysis of the PMS2 gene excludes exons 11-15, which overlap with a known pseudogene (PMS2CL).    Genetic Analysis Report      No pathogenic or likely pathogenic variants were detected in the genes analyzed by this test.Genetic test results should be interpreted in the context of an individual's personal medical and family history. Alteration to medical management is NOT  recommended based solely on this result. Clinical correlation is advised.Additional Considerations- This is a screening  test; individuals may still carry pathogenic or likely pathogenic variant(s) in the tested genes that are not detected by this test.-  For individuals at risk for these or other related conditions based on factors including personal or family history, diagnostic testing is recommended.- The absence of pathogenic or likely pathogenic variant(s) in the analyzed genes, while reassuring,  does not eliminate the possibility of a hereditary condition; there are other variants and genes associated with heart disease and hereditary cancer that are not included in this test.    Genes Tested See Notes    Disclaimer See Notes    Sequencing Location See Notes    Interpretation Methods and Limitations See Notes       Assessment & Plan:   Problem List Items Addressed This Visit       Other   Absolute anemia    History of while donating blood, check CBC, iron, ferritin, and B12 today.  Continue supplement and adjust as needed.      Relevant Orders   CBC with Differential/Platelet   Iron   Ferritin   Vitamin B12   Depression, major, single episode, moderate (HCC) - Primary    Chronic, ongoing.  Having more good then bad days.  Denies SI/HI.  Overall she reports stable mood.  Continue Celexa 40 MG daily + continue Buspar 10 MG TID dosing for anxiety.  Discussed BLACK BOX warning.  Could consider addition of Wellbutrin in future, while maintaining Celexa for anxiety.  Wellbutrin may improve motivation and depressive  symptoms.  Return in 6 months.      Relevant Orders   TSH   Elevated low density lipoprotein (LDL) cholesterol level    Noted on past labs, recheck today. Continue focus on healthy diet and regular exercise.      Relevant Orders   Comprehensive metabolic panel   Lipid Panel w/o Chol/HDL Ratio   Situational anxiety    Refer to depression plan of care.      Relevant Orders   TSH   Other Visit Diagnoses     Encounter for annual physical exam       Annual physical today with labs and health maintenance reviewed, discussed with patient.        Follow up plan: Return in about 6 months (around 05/30/2023) for ANXIETY.   LABORATORY TESTING:  - Pap smear: up to date  IMMUNIZATIONS:   - Tdap: Tetanus vaccination status reviewed: last tetanus booster within 10 years. - Influenza: getting on Friday to ensure all her children get their shots - Pneumovax: Not applicable - Prevnar: Not applicable - COVID: Up to date -- first 2 and booster - HPV: Not applicable - Shingrix vaccine: Up to date  SCREENING: -Mammogram: Up to date  - Colonoscopy: Up to date  - Bone Density: Not applicable  -Hearing Test: Not applicable  -Spirometry: Not applicable   PATIENT COUNSELING:   Advised to take 1 mg of folate supplement per day if capable of pregnancy.   Sexuality: Discussed sexually transmitted diseases, partner selection, use of condoms, avoidance of unintended pregnancy  and contraceptive alternatives.   Advised to avoid cigarette smoking.  I discussed with the patient that most people either abstain from alcohol or drink within safe limits (<=14/week and <=4 drinks/occasion for males, <=7/weeks and <= 3 drinks/occasion for females) and that the risk for alcohol disorders and other health effects rises proportionally with the number of drinks per week and how often a drinker exceeds daily limits.  Discussed cessation/primary  prevention of drug use and availability of treatment for  abuse.   Diet: Encouraged to adjust caloric intake to maintain  or achieve ideal body weight, to reduce intake of dietary saturated fat and total fat, to limit sodium intake by avoiding high sodium foods and not adding table salt, and to maintain adequate dietary potassium and calcium preferably from fresh fruits, vegetables, and low-fat dairy products.    Stressed the importance of regular exercise  Injury prevention: Discussed safety belts, safety helmets, smoke detector, smoking near bedding or upholstery.   Dental health: Discussed importance of regular tooth brushing, flossing, and dental visits.    NEXT PREVENTATIVE PHYSICAL DUE IN 1 YEAR. Return in about 6 months (around 05/30/2023) for ANXIETY.

## 2022-12-01 ENCOUNTER — Encounter: Payer: Self-pay | Admitting: Nurse Practitioner

## 2022-12-01 ENCOUNTER — Other Ambulatory Visit: Payer: Self-pay | Admitting: Nurse Practitioner

## 2022-12-01 DIAGNOSIS — D508 Other iron deficiency anemias: Secondary | ICD-10-CM

## 2022-12-01 LAB — VITAMIN B12: Vitamin B-12: >2000 pg/mL — ABNORMAL HIGH (ref 232–1245)

## 2022-12-01 LAB — LIPID PANEL W/O CHOL/HDL RATIO
Cholesterol, Total: 204 mg/dL — ABNORMAL HIGH (ref 100–199)
HDL: 95 mg/dL (ref >39)
LDL Chol Calc (NIH): 101 mg/dL — ABNORMAL HIGH (ref 0–99)
Triglycerides: 40 mg/dL (ref 0–149)
VLDL Cholesterol Cal: 8 mg/dL (ref 5–40)

## 2022-12-01 LAB — COMPREHENSIVE METABOLIC PANEL
ALT: 13 [IU]/L (ref 0–32)
AST: 27 [IU]/L (ref 0–40)
Albumin: 4.3 g/dL (ref 3.8–4.9)
Alkaline Phosphatase: 34 [IU]/L — ABNORMAL LOW (ref 44–121)
BUN/Creatinine Ratio: 13 (ref 9–23)
BUN: 13 mg/dL (ref 6–24)
Bilirubin Total: 0.4 mg/dL (ref 0.0–1.2)
CO2: 21 mmol/L (ref 20–29)
Calcium: 9.5 mg/dL (ref 8.7–10.2)
Chloride: 99 mmol/L (ref 96–106)
Creatinine, Ser: 0.97 mg/dL (ref 0.57–1.00)
Globulin, Total: 2.3 g/dL (ref 1.5–4.5)
Glucose: 81 mg/dL (ref 70–99)
Potassium: 4.1 mmol/L (ref 3.5–5.2)
Sodium: 134 mmol/L (ref 134–144)
Total Protein: 6.6 g/dL (ref 6.0–8.5)
eGFR: 69 mL/min/{1.73_m2} (ref >59)

## 2022-12-01 LAB — CBC WITH DIFFERENTIAL/PLATELET
Basophils Absolute: 0 10*3/uL (ref 0.0–0.2)
Basos: 1 %
EOS (ABSOLUTE): 0 10*3/uL (ref 0.0–0.4)
Eos: 0 %
Hematocrit: 30.1 % — ABNORMAL LOW (ref 34.0–46.6)
Hemoglobin: 9.6 g/dL — ABNORMAL LOW (ref 11.1–15.9)
Immature Grans (Abs): 0 10*3/uL (ref 0.0–0.1)
Immature Granulocytes: 0 %
Lymphocytes Absolute: 2.2 10*3/uL (ref 0.7–3.1)
Lymphs: 31 %
MCH: 28.3 pg (ref 26.6–33.0)
MCHC: 31.9 g/dL (ref 31.5–35.7)
MCV: 89 fL (ref 79–97)
Monocytes Absolute: 0.5 10*3/uL (ref 0.1–0.9)
Monocytes: 7 %
Neutrophils Absolute: 4.2 10*3/uL (ref 1.4–7.0)
Neutrophils: 61 %
Platelets: 298 10*3/uL (ref 150–450)
RBC: 3.39 x10E6/uL — ABNORMAL LOW (ref 3.77–5.28)
RDW: 14.4 % (ref 11.7–15.4)
WBC: 6.9 10*3/uL (ref 3.4–10.8)

## 2022-12-01 LAB — FERRITIN: Ferritin: 11 ng/mL — ABNORMAL LOW (ref 15–150)

## 2022-12-01 LAB — TSH: TSH: 1.15 u[IU]/mL (ref 0.450–4.500)

## 2022-12-01 LAB — IRON: Iron: 18 ug/dL — ABNORMAL LOW (ref 27–159)

## 2022-12-01 NOTE — Progress Notes (Signed)
Contacted via MyChart -- need lab visit in 6 weeks please    Good evening Carmen George, your labs have returned: - Kidney function, creatinine and eGFR, remains normal, as is liver function, AST and ALT.  - Lipid panel shows some elevations still, but no medication needed. Continue focus on diet and regular activity. - Thyroid is normal. B12 is above goal, reduce to taking every other day. - My concern is your hemoglobin and hematocrit are low, change from previous, and iron + ferritin are low.  Are you taking iron every day?  If not I want you to start Slow Fe or Vitron daily, one tablet.  You can get these over the counter.  Then I want to recheck labs in 6 weeks outpatient.  Please let me know if taking iron though because if you are that may change the plan.  Any questions? Keep being stellar!!  Thank you for allowing me to participate in your care.  I appreciate you. Kindest regards, Erikka Follmer

## 2022-12-06 NOTE — Progress Notes (Signed)
Attempted to reach patient, LVM to call office back to schedule 6 week lab only visit.  Put in CRM.

## 2022-12-20 ENCOUNTER — Other Ambulatory Visit: Payer: Self-pay | Admitting: Obstetrics and Gynecology

## 2022-12-20 DIAGNOSIS — Z1231 Encounter for screening mammogram for malignant neoplasm of breast: Secondary | ICD-10-CM

## 2023-01-16 ENCOUNTER — Other Ambulatory Visit: Payer: Self-pay | Admitting: Nurse Practitioner

## 2023-01-17 ENCOUNTER — Other Ambulatory Visit (INDEPENDENT_AMBULATORY_CARE_PROVIDER_SITE_OTHER): Payer: BC Managed Care – PPO

## 2023-01-17 DIAGNOSIS — D508 Other iron deficiency anemias: Secondary | ICD-10-CM

## 2023-01-17 NOTE — Progress Notes (Unsigned)
 rd

## 2023-01-18 LAB — CBC WITH DIFFERENTIAL/PLATELET
Basophils Absolute: 0.1 10*3/uL (ref 0.0–0.2)
Basos: 1 %
EOS (ABSOLUTE): 0.1 10*3/uL (ref 0.0–0.4)
Eos: 1 %
Hematocrit: 38.7 % (ref 34.0–46.6)
Hemoglobin: 12.2 g/dL (ref 11.1–15.9)
Immature Grans (Abs): 0 10*3/uL (ref 0.0–0.1)
Immature Granulocytes: 0 %
Lymphocytes Absolute: 1.8 10*3/uL (ref 0.7–3.1)
Lymphs: 39 %
MCH: 29.3 pg (ref 26.6–33.0)
MCHC: 31.5 g/dL (ref 31.5–35.7)
MCV: 93 fL (ref 79–97)
Monocytes Absolute: 0.3 10*3/uL (ref 0.1–0.9)
Monocytes: 7 %
Neutrophils Absolute: 2.4 10*3/uL (ref 1.4–7.0)
Neutrophils: 52 %
Platelets: 250 10*3/uL (ref 150–450)
RBC: 4.17 x10E6/uL (ref 3.77–5.28)
RDW: 15.1 % (ref 11.7–15.4)
WBC: 4.5 10*3/uL (ref 3.4–10.8)

## 2023-01-18 LAB — FERRITIN: Ferritin: 32 ng/mL (ref 15–150)

## 2023-01-18 LAB — IRON: Iron: 46 ug/dL (ref 27–159)

## 2023-01-18 NOTE — Progress Notes (Signed)
 Contacted via MyChart   Good morning Carmen George, only waiting on ferritin and will let you know if any concerns on that.  Overall though labs much improved this check.  I would continue supplement at home nad iron rich diet.  Any questions? Keep being stellar!!  Thank you for allowing me to participate in your care.  I appreciate you. Kindest regards, Jaydenn Boccio

## 2023-01-18 NOTE — Telephone Encounter (Signed)
 Requested Prescriptions  Pending Prescriptions Disp Refills   busPIRone  (BUSPAR ) 10 MG tablet [Pharmacy Med Name: BUSPIRONE  HCL 10 MG TABLET] 270 tablet 2    Sig: TAKE 1 TABLET BY MOUTH THREE TIMES A DAY     Psychiatry: Anxiolytics/Hypnotics - Non-controlled Passed - 01/18/2023 11:05 AM      Passed - Valid encounter within last 12 months    Recent Outpatient Visits           1 month ago Depression, major, single episode, moderate (HCC)   [place] [facility] [place], [place] T, NP   6 months ago Depression, major, single episode, moderate (HCC)   [place] [facility] [place], [place] T, NP   8 months ago Depression, major, single episode, moderate (HCC)   [place] [facility] [place], Melanie DASEN, NP   9 months ago Situational anxiety   [place] [facility] [place], [place] T, NP   11 months ago Depression, major, single episode, moderate (HCC)   [place] [facility] [place], Melanie DASEN, NP       Future Appointments             In 4 months Cannady, Jolene T, NP  [facility], [facility]

## 2023-03-04 ENCOUNTER — Other Ambulatory Visit: Payer: Self-pay | Admitting: Nurse Practitioner

## 2023-03-07 NOTE — Telephone Encounter (Signed)
 Request too soon, last ordered 06/11/22 #90, 4 refills  Requested Prescriptions  Pending Prescriptions Disp Refills   citalopram (CELEXA) 40 MG tablet [Pharmacy Med Name: CITALOPRAM HBR 40 MG TABLET] 90 tablet 5    Sig: TAKE 1 TABLET BY MOUTH EVERY DAY     Psychiatry:  Antidepressants - SSRI Passed - 03/07/2023 10:51 AM      Passed - Completed PHQ-2 or PHQ-9 in the last 360 days      Passed - Valid encounter within last 6 months    Recent Outpatient Visits           3 months ago Depression, major, single episode, moderate (HCC)   [place] [facility] [place], [place] T, NP   8 months ago Depression, major, single episode, moderate (HCC)   [place] [facility] [place], [place] T, NP   9 months ago Depression, major, single episode, moderate (HCC)   [place] [facility] [place], Dorie Rank, NP   11 months ago Situational anxiety   [place] [facility] [place], [place] T, NP   1 year ago Depression, major, single episode, moderate (HCC)   [place] [facility] [place], Dorie Rank, NP       Future Appointments             In 2 months Cannady, Dorie Rank, NP  [facility]

## 2023-05-31 ENCOUNTER — Ambulatory Visit: Payer: Self-pay | Admitting: Nurse Practitioner

## 2023-06-04 NOTE — Patient Instructions (Signed)
 Be Involved in Caring For Your Health:  Taking Medications When medications are taken as directed, they can greatly improve your health. But if they are not taken as prescribed, they may not work. In some cases, not taking them correctly can be harmful. To help ensure your treatment remains effective and safe, understand your medications and how to take them. Bring your medications to each visit for review by your provider.  Your lab results, notes, and after visit summary will be available on My Chart. We strongly encourage you to use this feature. If lab results are abnormal the clinic will contact you with the appropriate steps. If the clinic does not contact you assume the results are satisfactory. You can always view your results on My Chart. If you have questions regarding your health or results, please contact the clinic during office hours. You can also ask questions on My Chart.  We at [facility] are grateful that you chose [place] to provide your care. We strive to provide evidence-based and compassionate care and are always looking for feedback. If you get a survey from the clinic please complete this so we can hear your opinions.  Managing Anxiety, Adult After being diagnosed with anxiety, you may be relieved to know why you have felt or behaved a certain way. You may also feel overwhelmed about the treatment ahead and what it will mean for your life. With care and support, you can manage your anxiety. How to manage lifestyle changes Understanding the difference between stress and anxiety Although stress can play a role in anxiety, it is not the same as anxiety. Stress is your body's reaction to life changes and events, both good and bad. Stress is often caused by something external, such as a deadline, test, or competition. It normally goes away after the event has ended and will last just a few hours. But, stress can be ongoing and can lead to more than just stress. Anxiety is  caused by something internal, such as imagining a terrible outcome or worrying that something will go wrong that will greatly upset you. Anxiety often does not go away even after the event is over, and it can become a long-term (chronic) worry. Lowering stress and anxiety Talk with your health care provider or a counselor to learn more about lowering anxiety and stress. They may suggest tension-reduction techniques, such as: Music. Spend time creating or listening to music that you enjoy and that inspires you. Mindfulness-based meditation. Practice being aware of your normal breaths while not trying to control your breathing. It can be done while sitting or walking. Centering prayer. Focus on a word, phrase, or sacred image that means something to you and brings you peace. Deep breathing. Expand your stomach and inhale slowly through your nose. Hold your breath for 3-5 seconds. Then breathe out slowly, letting your stomach muscles relax. Self-talk. Learn to notice and spot thought patterns that lead to anxiety reactions. Change those patterns to thoughts that feel peaceful. Muscle relaxation. Take time to tense muscles and then relax them. Choose a tension-reduction technique that fits your lifestyle and personality. These techniques take time and practice. Set aside 5-15 minutes a day to do them. Specialized therapists can offer counseling and training in these techniques. The training to help with anxiety may be covered by some insurance plans. Other things you can do to manage stress and anxiety include: Keeping a stress diary. This can help you learn what triggers your reaction and then learn ways  to manage your response. Thinking about how you react to certain situations. You may not be able to control everything, but you can control your response. Making time for activities that help you relax and not feeling guilty about spending your time in this way. Doing visual imagery. This involves  imagining or creating mental pictures to help you relax. Practicing yoga. Through yoga poses, you can lower tension and relax.  Medicines Medicines for anxiety include: Antidepressant medicines. These are usually prescribed for long-term daily control. Anti-anxiety medicines. These may be added in severe cases, especially when panic attacks occur. When used together, medicines, psychotherapy, and tension-reduction techniques may be the most effective treatment. Relationships Relationships can play a big part in helping you recover. Spend more time connecting with trusted friends and family members. Think about going to couples counseling if you have a partner, taking family education classes, or going to family therapy. Therapy can help you and others better understand your anxiety. How to recognize changes in your anxiety Everyone responds differently to treatment for anxiety. Recovery from anxiety happens when symptoms lessen and stop interfering with your daily life at home or work. This may mean that you will start to: Have better concentration and focus. Worry will interfere less in your daily thinking. Sleep better. Be less irritable. Have more energy. Have improved memory. Try to recognize when your condition is getting worse. Contact your provider if your symptoms interfere with home or work and you feel like your condition is not improving. Follow these instructions at home: Activity Exercise. Adults should: Exercise for at least 150 minutes each week. The exercise should increase your heart rate and make you sweat (moderate-intensity exercise). Do strengthening exercises at least twice a week. Get the right amount and quality of sleep. Most adults need 7-9 hours of sleep each night. Lifestyle  Eat a healthy diet that includes plenty of vegetables, fruits, whole grains, low-fat dairy products, and lean protein. Do not eat a lot of foods that are high in fats, added sugars, or salt  (sodium). Make choices that simplify your life. Do not use any products that contain nicotine or tobacco. These products include cigarettes, chewing tobacco, and vaping devices, such as e-cigarettes. If you need help quitting, ask your provider. Avoid caffeine, alcohol, and certain over-the-counter cold medicines. These may make you feel worse. Ask your pharmacist which medicines to avoid. General instructions Take over-the-counter and prescription medicines only as told by your provider. Keep all follow-up visits. This is to make sure you are managing your anxiety well or if you need more support. Where to find support You can get help and support from: Self-help groups. Online and [employer]. A trusted spiritual leader. Couples counseling. Family education classes. Family therapy. Where to find more information You may find that joining a support group helps you deal with your anxiety. The following sources can help you find counselors or support groups near you: Mental Health America: mentalhealthamerica.net Anxiety and Depression Association of [place] (ADAA): adaa.org The [employer] on Mental Illness (NAMI): nami.org Contact a health care provider if: You have a hard time staying focused or finishing tasks. You spend many hours a day feeling worried about everyday life. You are very tired because you cannot stop worrying. You start to have headaches or often feel tense. You have chronic nausea or diarrhea. Get help right away if: Your heart feels like it is racing. You have shortness of breath. You have thoughts of hurting yourself or others. Get help  right away if you feel like you may hurt yourself or others, or have thoughts about taking your own life. Go to your nearest emergency room or: Call 911. Call the National Suicide Prevention Lifeline at 765-482-1593 or 988. This is open 24 hours a day. Text the Crisis Text Line at 504 124 9896. This information is not  intended to replace advice given to you by your health care provider. Make sure you discuss any questions you have with your health care provider. Document Revised: 09/29/2021 Document Reviewed: 04/13/2020 Elsevier Patient Education  2024 [employer].

## 2023-06-06 ENCOUNTER — Ambulatory Visit
Admission: RE | Admit: 2023-06-06 | Discharge: 2023-06-06 | Disposition: A | Discharge status: Home / Self Care | Source: Ambulatory Visit | Attending: Obstetrics and Gynecology | Admitting: Obstetrics and Gynecology

## 2023-06-06 DIAGNOSIS — Z1231 Encounter for screening mammogram for malignant neoplasm of breast: Secondary | ICD-10-CM | POA: Insufficient documentation

## 2023-06-08 ENCOUNTER — Ambulatory Visit: Admitting: Nurse Practitioner

## 2023-06-08 ENCOUNTER — Encounter: Payer: Self-pay | Admitting: Nurse Practitioner

## 2023-06-08 VITALS — BP 91/58 | HR 62 | Temp 98.1°F | Wt 113.2 lb

## 2023-06-08 DIAGNOSIS — F321 Major depressive disorder, single episode, moderate: Secondary | ICD-10-CM

## 2023-06-08 DIAGNOSIS — F418 Other specified anxiety disorders: Secondary | ICD-10-CM

## 2023-06-08 MED ORDER — BUPROPION HCL ER (XL) 150 MG PO TB24: 150.0000 mg | ORAL_TABLET | Freq: Every day | ORAL | 1 refills | Status: DC

## 2023-06-08 NOTE — Assessment & Plan Note (Signed)
 Refer to depression plan of care.

## 2023-06-08 NOTE — Assessment & Plan Note (Signed)
 Chronic, exacerbated by loss of job.  Struggling with motivation.  Denies SI/HI.  PHQ scores have trended up, GAD scores slight trend down.  Will maintain Celexa  40 MG and Buspar  10 MG TID PRN, but add on Wellbutrin XL 150 MG which may benefit her anhedonia, concentration, and fatigue.  Educated her on this medication and side effects to monitor for.  Discussed that this medication works differently than Celexa  and does not come with weight gain or decline in sexual drive, that at times it can help if loss of sexual drive with SSRI. BLACK BOX warning discussed.

## 2023-06-08 NOTE — Progress Notes (Signed)
 BP (!) 91/58   Pulse 62   Temp 98.1 F (36.7 C) (Oral)   Wt 113 lb 3.2 oz (51.3 kg)   LMP 06/05/2018 (Approximate)   SpO2 99%   BMI 19.43 kg/m    Subjective:    Patient ID: Darolyn Elks, female    DOB: April 20, 1965, 58 y.o.   MRN: [id number]  HPI: VALE PERAZA is a 58 y.o. female  Chief Complaint  Patient presents with   Anxiety   Depression   ANXIETY/STRESS Continues on Celexa  40 MG daily and Buspar  10 MG TID as needed, uses three to four times a week.  Recently lost her job, which is increased stressor.  Working with [employer] operational [employer] at present.  Still going to therapy. Continues to not sleep well, recently started Combipatch with GYN due to poor sleep and night sweats. Duration:stable Anxious mood: yes Irritability: yes  Sweating: no Nausea: no Palpitations:no Hyperventilation: no Panic attacks: no Agoraphobia: no  Obscessions/compulsions: no Depressed mood: yes    2023/07/04    9:05 AM 11/30/2022    2:00 PM 07/06/2022    1:36 PM 05/18/2022    3:26 PM 04/06/2022    1:54 PM  Depression screen PHQ 2/9  Decreased Interest 2 1 2 1 1   Down, Depressed, Hopeless 2 1 1 2 2   PHQ - 2 Score 4 2 3 3 3   Altered sleeping 3 2 3 3 3   Tired, decreased energy 3 1 2 2 1   Change in appetite 3 1 2 2 2   Feeling bad or failure about yourself  1 1 1  0 1  Trouble concentrating 2 2 2 2 1   Moving slowly or fidgety/restless 0 0 0 0 0  Suicidal thoughts 0 0 0 0 0  PHQ-9 Score 16 9 13 12 11   Difficult doing work/chores Very difficult Somewhat difficult Somewhat difficult Somewhat difficult Somewhat difficult  Anhedonia: yes Weight changes: no Insomnia: yes hard to fall asleep  Hypersomnia: no Fatigue/loss of energy: yes Feelings of worthlessness: no Feelings of guilt: no Impaired concentration/indecisiveness: staying focused trouble Suicidal ideations: no  Crying spells: no Recent Stressors/Life Changes: yes   Relationship problems: no   Family stress: no   Financial stress: no     Job stress: yes    Recent death/loss: no     07/04/2023    9:07 AM 11/30/2022    2:00 PM 05/18/2022    3:26 PM 04/06/2022    1:58 PM  GAD 7 : Generalized Anxiety Score  Nervous, Anxious, on Edge 1 1 3 2   Control/stop worrying 2 2 2 2   Worry too much - different things 2 2 1 2   Trouble relaxing 0 1 1 2   Restless 0 0 0 3  Easily annoyed or irritable 2 2 2 3   Afraid - awful might happen 1 1 1 1   Total GAD 7 Score 8 9 10 15   Anxiety Difficulty Somewhat difficult Somewhat difficult Somewhat difficult Somewhat difficult   Relevant past medical, surgical, family and social history reviewed and updated as indicated. Interim medical history since our last visit reviewed. Allergies and medications reviewed and updated.  Review of Systems  Constitutional:  Negative for activity change, appetite change, diaphoresis, fatigue and fever.  Respiratory:  Negative for cough, chest tightness and shortness of breath.   Cardiovascular:  Negative for chest pain, palpitations and leg swelling.  Gastrointestinal: Negative.   Neurological: Negative.   Psychiatric/Behavioral:  Positive for sleep disturbance. Negative for decreased concentration, self-injury and  suicidal ideas. The patient is nervous/anxious.     Per HPI unless specifically indicated above     Objective:    BP (!) 91/58   Pulse 62   Temp 98.1 F (36.7 C) (Oral)   Wt 113 lb 3.2 oz (51.3 kg)   LMP 06/05/2018 (Approximate)   SpO2 99%   BMI 19.43 kg/m   Wt Readings from Last 3 Encounters:  06/08/23 113 lb 3.2 oz (51.3 kg)  11/30/22 122 lb (55.3 kg)  05/18/22 128 lb 11.2 oz (58.4 kg)    Physical Exam Vitals and nursing note reviewed.  Constitutional:      General: She is awake. She is not in acute distress.    Appearance: She is well-developed and well-groomed. She is not ill-appearing or toxic-appearing.  HENT:     Head: Normocephalic.     Right Ear: Hearing and external ear normal.     Left Ear: Hearing and external ear  normal.  Eyes:     General: Lids are normal.        Right eye: No discharge.        Left eye: No discharge.     Conjunctiva/sclera: Conjunctivae normal.     Pupils: Pupils are equal, round, and reactive to light.  Neck:     Thyroid : No thyromegaly.     Vascular: No carotid bruit.  Cardiovascular:     Rate and Rhythm: Normal rate and regular rhythm.     Heart sounds: Normal heart sounds. No murmur heard.    No gallop.  Pulmonary:     Effort: Pulmonary effort is normal. No accessory muscle usage or respiratory distress.     Breath sounds: Normal breath sounds.  Abdominal:     General: Bowel sounds are normal. There is no distension.     Palpations: Abdomen is soft.     Tenderness: There is no abdominal tenderness.  Musculoskeletal:     Cervical back: Normal range of motion and neck supple.     Right lower leg: No edema.     Left lower leg: No edema.  Lymphadenopathy:     Cervical: No cervical adenopathy.  Skin:    General: Skin is warm and dry.  Neurological:     Mental Status: She is alert and oriented to person, place, and time.     Deep Tendon Reflexes: Reflexes are normal and symmetric.     Reflex Scores:      Brachioradialis reflexes are 2+ on the right side and 2+ on the left side.      Patellar reflexes are 2+ on the right side and 2+ on the left side. Psychiatric:        Attention and Perception: Attention normal.        Mood and Affect: Mood normal.        Speech: Speech normal.        Behavior: Behavior normal. Behavior is cooperative.        Thought Content: Thought content normal.    Results for orders placed or performed in visit on 01/17/23  CBC with Differential/Platelet   Collection Time: 01/17/23 10:17 AM  Result Value Ref Range   WBC 4.5 3.4 - 10.8 x10E3/uL   RBC 4.17 3.77 - 5.28 x10E6/uL   Hemoglobin 12.2 11.1 - 15.9 g/dL   Hematocrit 16.1 09.6 - 46.6 %   MCV 93 79 - 97 fL   MCH 29.3 26.6 - 33.0 pg   MCHC 31.5 31.5 - 35.7 g/dL  RDW 15.1 11.7 -  15.4 %   Platelets 250 150 - 450 x10E3/uL   Neutrophils 52 Not Estab. %   Lymphs 39 Not Estab. %   Monocytes 7 Not Estab. %   Eos 1 Not Estab. %   Basos 1 Not Estab. %   Neutrophils Absolute 2.4 1.4 - 7.0 x10E3/uL   Lymphocytes Absolute 1.8 0.7 - 3.1 x10E3/uL   Monocytes Absolute 0.3 0.1 - 0.9 x10E3/uL   EOS (ABSOLUTE) 0.1 0.0 - 0.4 x10E3/uL   Basophils Absolute 0.1 0.0 - 0.2 x10E3/uL   Immature Granulocytes 0 Not Estab. %   Immature Grans (Abs) 0.0 0.0 - 0.1 x10E3/uL  Ferritin   Collection Time: 01/17/23 10:17 AM  Result Value Ref Range   Ferritin 32 15 - 150 ng/mL  Iron   Collection Time: 01/17/23 10:17 AM  Result Value Ref Range   Iron 46 27 - 159 ug/dL      Assessment & Plan:   Problem List Items Addressed This Visit       Other   Situational anxiety   Refer to depression plan of care.      Relevant Medications   buPROPion (WELLBUTRIN XL) 150 MG 24 hr tablet   Depression, major, single episode, moderate (HCC) - Primary   Chronic, exacerbated by loss of job.  Struggling with motivation.  Denies SI/HI.  PHQ scores have trended up, GAD scores slight trend down.  Will maintain Celexa  40 MG and Buspar  10 MG TID PRN, but add on Wellbutrin XL 150 MG which may benefit her anhedonia, concentration, and fatigue.  Educated her on this medication and side effects to monitor for.  Discussed that this medication works differently than Celexa  and does not come with weight gain or decline in sexual drive, that at times it can help if loss of sexual drive with SSRI. BLACK BOX warning discussed.      Relevant Medications   buPROPion (WELLBUTRIN XL) 150 MG 24 hr tablet      Follow up plan: Return in about 4 weeks (around 07/06/2023) for Depression, ANXIETY -- virtual and schedule annual physical after 11/30/23.

## 2023-06-22 ENCOUNTER — Ambulatory Visit: Payer: Self-pay | Admitting: Obstetrics and Gynecology

## 2023-07-02 NOTE — Patient Instructions (Signed)
 Be Involved in Caring For Your Health:  Taking Medications When medications are taken as directed, they can greatly improve your health. But if they are not taken as prescribed, they may not work. In some cases, not taking them correctly can be harmful. To help ensure your treatment remains effective and safe, understand your medications and how to take them. Bring your medications to each visit for review by your provider.  Your lab results, notes, and after visit summary will be available on My Chart. We strongly encourage you to use this feature. If lab results are abnormal the clinic will contact you with the appropriate steps. If the clinic does not contact you assume the results are satisfactory. You can always view your results on My Chart. If you have questions regarding your health or results, please contact the clinic during office hours. You can also ask questions on My Chart.  We at [facility] are grateful that you chose [place] to provide your care. We strive to provide evidence-based and compassionate care and are always looking for feedback. If you get a survey from the clinic please complete this so we can hear your opinions.  Managing Anxiety, Adult After being diagnosed with anxiety, you may be relieved to know why you have felt or behaved a certain way. You may also feel overwhelmed about the treatment ahead and what it will mean for your life. With care and support, you can manage your anxiety. How to manage lifestyle changes Understanding the difference between stress and anxiety Although stress can play a role in anxiety, it is not the same as anxiety. Stress is your body's reaction to life changes and events, both good and bad. Stress is often caused by something external, such as a deadline, test, or competition. It normally goes away after the event has ended and will last just a few hours. But, stress can be ongoing and can lead to more than just stress. Anxiety is  caused by something internal, such as imagining a terrible outcome or worrying that something will go wrong that will greatly upset you. Anxiety often does not go away even after the event is over, and it can become a long-term (chronic) worry. Lowering stress and anxiety Talk with your health care provider or a counselor to learn more about lowering anxiety and stress. They may suggest tension-reduction techniques, such as: Music. Spend time creating or listening to music that you enjoy and that inspires you. Mindfulness-based meditation. Practice being aware of your normal breaths while not trying to control your breathing. It can be done while sitting or walking. Centering prayer. Focus on a word, phrase, or sacred image that means something to you and brings you peace. Deep breathing. Expand your stomach and inhale slowly through your nose. Hold your breath for 3-5 seconds. Then breathe out slowly, letting your stomach muscles relax. Self-talk. Learn to notice and spot thought patterns that lead to anxiety reactions. Change those patterns to thoughts that feel peaceful. Muscle relaxation. Take time to tense muscles and then relax them. Choose a tension-reduction technique that fits your lifestyle and personality. These techniques take time and practice. Set aside 5-15 minutes a day to do them. Specialized therapists can offer counseling and training in these techniques. The training to help with anxiety may be covered by some insurance plans. Other things you can do to manage stress and anxiety include: Keeping a stress diary. This can help you learn what triggers your reaction and then learn ways  to manage your response. Thinking about how you react to certain situations. You may not be able to control everything, but you can control your response. Making time for activities that help you relax and not feeling guilty about spending your time in this way. Doing visual imagery. This involves  imagining or creating mental pictures to help you relax. Practicing yoga. Through yoga poses, you can lower tension and relax.  Medicines Medicines for anxiety include: Antidepressant medicines. These are usually prescribed for long-term daily control. Anti-anxiety medicines. These may be added in severe cases, especially when panic attacks occur. When used together, medicines, psychotherapy, and tension-reduction techniques may be the most effective treatment. Relationships Relationships can play a big part in helping you recover. Spend more time connecting with trusted friends and family members. Think about going to couples counseling if you have a partner, taking family education classes, or going to family therapy. Therapy can help you and others better understand your anxiety. How to recognize changes in your anxiety Everyone responds differently to treatment for anxiety. Recovery from anxiety happens when symptoms lessen and stop interfering with your daily life at home or work. This may mean that you will start to: Have better concentration and focus. Worry will interfere less in your daily thinking. Sleep better. Be less irritable. Have more energy. Have improved memory. Try to recognize when your condition is getting worse. Contact your provider if your symptoms interfere with home or work and you feel like your condition is not improving. Follow these instructions at home: Activity Exercise. Adults should: Exercise for at least 150 minutes each week. The exercise should increase your heart rate and make you sweat (moderate-intensity exercise). Do strengthening exercises at least twice a week. Get the right amount and quality of sleep. Most adults need 7-9 hours of sleep each night. Lifestyle  Eat a healthy diet that includes plenty of vegetables, fruits, whole grains, low-fat dairy products, and lean protein. Do not eat a lot of foods that are high in fats, added sugars, or salt  (sodium). Make choices that simplify your life. Do not use any products that contain nicotine or tobacco. These products include cigarettes, chewing tobacco, and vaping devices, such as e-cigarettes. If you need help quitting, ask your provider. Avoid caffeine, alcohol, and certain over-the-counter cold medicines. These may make you feel worse. Ask your pharmacist which medicines to avoid. General instructions Take over-the-counter and prescription medicines only as told by your provider. Keep all follow-up visits. This is to make sure you are managing your anxiety well or if you need more support. Where to find support You can get help and support from: Self-help groups. Online and [employer]. A trusted spiritual leader. Couples counseling. Family education classes. Family therapy. Where to find more information You may find that joining a support group helps you deal with your anxiety. The following sources can help you find counselors or support groups near you: Mental Health America: mentalhealthamerica.net Anxiety and Depression Association of [place] (ADAA): adaa.org The [employer] on Mental Illness (NAMI): nami.org Contact a health care provider if: You have a hard time staying focused or finishing tasks. You spend many hours a day feeling worried about everyday life. You are very tired because you cannot stop worrying. You start to have headaches or often feel tense. You have chronic nausea or diarrhea. Get help right away if: Your heart feels like it is racing. You have shortness of breath. You have thoughts of hurting yourself or others. Get help  right away if you feel like you may hurt yourself or others, or have thoughts about taking your own life. Go to your nearest emergency room or: Call 911. Call the National Suicide Prevention Lifeline at 765-482-1593 or 988. This is open 24 hours a day. Text the Crisis Text Line at 504 124 9896. This information is not  intended to replace advice given to you by your health care provider. Make sure you discuss any questions you have with your health care provider. Document Revised: 09/29/2021 Document Reviewed: 04/13/2020 Elsevier Patient Education  2024 [employer].

## 2023-07-07 ENCOUNTER — Telehealth: Admitting: Nurse Practitioner

## 2023-07-07 DIAGNOSIS — F418 Other specified anxiety disorders: Secondary | ICD-10-CM

## 2023-07-07 DIAGNOSIS — F321 Major depressive disorder, single episode, moderate: Secondary | ICD-10-CM | POA: Diagnosis not present

## 2023-07-07 NOTE — Assessment & Plan Note (Signed)
 Chronic, improving with Wellbutrin  on board.  Denies SI/HI.  PHQ scores have trended down and GAD remains similar.  Will continue Wellbutrin  XL 150 MG daily, Celexa  40 MG and Buspar  10 MG TID PRN.  Educated her on regimen and side effects to monitor for.  Discussed that Wellbutrin  works differently than Celexa  and does not come with weight gain or decline in sexual drive, that at times it can help if loss of sexual drive with SSRI. BLACK BOX warning discussed.

## 2023-07-07 NOTE — Assessment & Plan Note (Signed)
 Refer to depression plan of care.

## 2023-07-07 NOTE — Progress Notes (Signed)
 LMP 06/05/2018 (Approximate)    Subjective:    Patient ID: Carmen George, female    DOB: 05-26-1965, 58 y.o.   MRN: [id number]  HPI: Carmen George is a 58 y.o. female  Chief Complaint  Patient presents with   Depression/Anxiety    Started Wellbutrin , no side effects. Improvement in symptoms.    Virtual Visit via Video Note  I connected with Carmen George on 2023/07/09 at 10:40 AM EDT by a video enabled telemedicine application and verified that I am speaking with the correct person using two identifiers.  Location: Patient: home Provider: work   I discussed the limitations of evaluation and management by telemedicine and the availability of in person appointments. The patient expressed understanding and agreed to proceed.  I discussed the assessment and treatment plan with the patient. The patient was provided an opportunity to ask questions and all were answered. The patient agreed with the plan and demonstrated an understanding of the instructions.   The patient was advised to call back or seek an in-person evaluation if the symptoms worsen or if the condition fails to improve as anticipated.  I provided 25 minutes of non-face-to-face time during this encounter.   Velvia Mehrer T Addisyn Leclaire, NP   ANXIETY/STRESS Follow-up today for starting Wellbutrin  on 06/08/23 due to anhedonia, feels more motivated and more days feeling like getting up.  Continues to take Celexa  40 MG daily and Buspar  10 MG TID as needed. Uses three to four times a week.  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  Continues to go to therapy. Duration:stable Anxious mood: yes Irritability: yes  Sweating: no Nausea: no Palpitations:no Hyperventilation: no Panic attacks: no Agoraphobia: no  Obscessions/compulsions: no Depressed mood: occasional    07/09/2023   10:39 AM 06/08/2023    9:05 AM 11/30/2022    2:00 PM 07/06/2022    1:36 PM 05/18/2022    3:26 PM   Depression screen PHQ 2/9  Decreased Interest 1 2 1 2 1   Down, Depressed, Hopeless 2 2 1 1 2   PHQ - 2 Score 3 4 2 3 3   Altered sleeping 2 3 2 3 3   Tired, decreased energy 1 3 1 2 2   Change in appetite 1 3 1 2 2   Feeling bad or failure about yourself  1 1 1 1  0  Trouble concentrating 1 2 2 2 2   Moving slowly or fidgety/restless 0 0 0 0 0  Suicidal thoughts 0 0 0 0 0  PHQ-9 Score 9 16 9 13 12   Difficult doing work/chores Somewhat difficult Very difficult Somewhat difficult Somewhat difficult Somewhat difficult  Anhedonia: yes Weight changes: no Insomnia: feels this is getting better with Combipatch Hypersomnia: no Fatigue/loss of energy: yes Feelings of worthlessness: no Feelings of guilt: no Impaired concentration/indecisiveness: no Suicidal ideations: no  Crying spells: no Recent Stressors/Life Changes: yes   Relationship problems: no   Family stress: no   Financial stress: no    Job stress: yes    Recent death/loss: no     Jul 09, 2023   10:41 AM 06/08/2023    9:07 AM 11/30/2022    2:00 PM 05/18/2022    3:26 PM  GAD 7 : Generalized Anxiety Score  Nervous, Anxious, on Edge 1 1 1 3   Control/stop worrying 1 2 2 2   Worry too much - different things 1 2 2 1   Trouble relaxing 1 0 1 1  Restless 1 0 0 0  Easily annoyed or irritable 2 2 2 2   Afraid - awful might happen 1 1 1 1   Total GAD 7 Score 8 8 9 10   Anxiety Difficulty Somewhat difficult Somewhat difficult Somewhat difficult Somewhat difficult   Relevant past medical, surgical, family and social history reviewed and updated as indicated. Interim medical history since our last visit reviewed. Allergies and medications reviewed and updated.  Review of Systems  Constitutional:  Negative for activity change, appetite change, diaphoresis, fatigue and fever.  Respiratory:  Negative for cough, chest tightness and shortness of breath.   Cardiovascular:  Negative for chest pain, palpitations and leg swelling.  Gastrointestinal:  Negative.   Neurological: Negative.   Psychiatric/Behavioral:  Positive for sleep disturbance. Negative for decreased concentration, self-injury and suicidal ideas. The patient is nervous/anxious.     Per HPI unless specifically indicated above     Objective:    LMP 06/05/2018 (Approximate)   Wt Readings from Last 3 Encounters:  06/08/23 113 lb 3.2 oz (51.3 kg)  11/30/22 122 lb (55.3 kg)  05/18/22 128 lb 11.2 oz (58.4 kg)    Physical Exam Vitals and nursing note reviewed.  Constitutional:      General: She is awake. She is not in acute distress.    Appearance: She is well-developed. She is not ill-appearing.  HENT:     Head: Normocephalic.     Right Ear: Hearing normal.     Left Ear: Hearing normal.  Eyes:     General: Lids are normal.        Right eye: No discharge.        Left eye: No discharge.     Conjunctiva/sclera: Conjunctivae normal.  Pulmonary:     Effort: Pulmonary effort is normal. No accessory muscle usage or respiratory distress.  Musculoskeletal:     Cervical back: Normal range of motion.  Neurological:     Mental Status: She is alert and oriented to person, place, and time.  Psychiatric:        Attention and Perception: Attention normal.        Mood and Affect: Mood normal.        Behavior: Behavior normal. Behavior is cooperative.        Thought Content: Thought content normal.        Judgment: Judgment normal.    Results for orders placed or performed in visit on 01/17/23  CBC with Differential/Platelet   Collection Time: 01/17/23 10:17 AM  Result Value Ref Range   WBC 4.5 3.4 - 10.8 x10E3/uL   RBC 4.17 3.77 - 5.28 x10E6/uL   Hemoglobin 12.2 11.1 - 15.9 g/dL   Hematocrit 61.2 65.9 - 46.6 %   MCV 93 79 - 97 fL   MCH 29.3 26.6 - 33.0 pg   MCHC 31.5 31.5 - 35.7 g/dL   RDW 84.8 88.2 - 84.5 %   Platelets 250 150 - 450 x10E3/uL   Neutrophils 52 Not Estab. %   Lymphs 39 Not Estab. %   Monocytes 7 Not Estab. %   Eos 1 Not Estab. %   Basos 1 Not  Estab. %   Neutrophils Absolute 2.4 1.4 - 7.0 x10E3/uL   Lymphocytes Absolute 1.8 0.7 - 3.1 x10E3/uL   Monocytes Absolute 0.3 0.1 - 0.9 x10E3/uL   EOS (ABSOLUTE) 0.1 0.0 - 0.4 x10E3/uL   Basophils Absolute 0.1 0.0 - 0.2 x10E3/uL   Immature Granulocytes 0 Not Estab. %   Immature Grans (Abs) 0.0 0.0 - 0.1 x10E3/uL  Ferritin  Collection Time: 01/17/23 10:17 AM  Result Value Ref Range   Ferritin 32 15 - 150 ng/mL  Iron   Collection Time: 01/17/23 10:17 AM  Result Value Ref Range   Iron 46 27 - 159 ug/dL      Assessment & Plan:   Problem List Items Addressed This Visit       Other   Situational anxiety   Refer to depression plan of care.      Depression, major, single episode, moderate (HCC) - Primary   Chronic, improving with Wellbutrin  on board.  Denies SI/HI.  PHQ scores have trended down and GAD remains similar.  Will continue Wellbutrin  XL 150 MG daily, Celexa  40 MG and Buspar  10 MG TID PRN.  Educated her on regimen and side effects to monitor for.  Discussed that Wellbutrin  works differently than Celexa  and does not come with weight gain or decline in sexual drive, that at times it can help if loss of sexual drive with SSRI. BLACK BOX warning discussed.          Follow up plan: Return for as scheduled in December for physical.

## 2023-09-20 ENCOUNTER — Other Ambulatory Visit: Payer: Self-pay | Admitting: Nurse Practitioner

## 2023-09-22 NOTE — Telephone Encounter (Signed)
 Requested Prescriptions  Pending Prescriptions Disp Refills   citalopram  (CELEXA ) 40 MG tablet [Pharmacy Med Name: CITALOPRAM  HBR 40 MG TABLET] 90 tablet 1    Sig: TAKE 1 TABLET BY MOUTH EVERY DAY     Psychiatry:  Antidepressants - SSRI Passed - 09/22/2023 10:27 AM      Passed - Completed PHQ-2 or PHQ-9 in the last 360 days      Passed - Valid encounter within last 6 months    Recent Outpatient Visits           2 months ago Depression, major, single episode, moderate (HCC)   [facility] [place], Duncan T, NP   3 months ago Depression, major, single episode, moderate (HCC)   [facility] [place], Melanie DASEN, NP

## 2023-10-11 ENCOUNTER — Ambulatory Visit

## 2023-10-11 ENCOUNTER — Ambulatory Visit
Admission: RE | Admit: 2023-10-11 | Discharge: 2023-10-11 | Disposition: A | Discharge status: Home / Self Care | Source: Ambulatory Visit

## 2023-10-11 VITALS — BP 111/68 | HR 67 | Temp 98.2°F | Resp 16

## 2023-10-11 DIAGNOSIS — J209 Acute bronchitis, unspecified: Secondary | ICD-10-CM

## 2023-10-11 MED ORDER — PROMETHAZINE-DM 6.25-15 MG/5ML PO SYRP: 5.0000 mL | ORAL_SOLUTION | Freq: Four times a day (QID) | ORAL | 0 refills | Status: DC | PRN

## 2023-10-11 MED ORDER — PREDNISONE 10 MG (21) PO TBPK: ORAL_TABLET | Freq: Every day | ORAL | 0 refills | Status: DC

## 2023-10-11 NOTE — ED Provider Notes (Signed)
 [facility]    CSN: [id number] Arrival date & time: 10/11/23  9085      History   Chief Complaint Chief Complaint  Patient presents with   Cough    Entered by patient    HPI Carmen George is a 58 y.o. female presenting with cough for 1 month.  History MDD, elevated LDL; no prior history of pulmonary disease (asthma, COPD, smoking).  Cough was precipitated by a mild viral syndrome.  Cough was initially nonproductive, but is now productive of clear or white sputum.  Cough is worse at night.  There is no associated shortness of breath, chest pain, fevers, dizziness, weakness, nausea, vomiting, diarrhea, rash.  The patient has attempted Delsym and NyQuil with minimal relief at home.  HPI  Past Medical History:  Diagnosis Date   Chicken pox    Depression    Situational anxiety 12/31/2021   Wears contact lenses     Patient Active Problem List   Diagnosis Date Noted   Depression, major, single episode, moderate (HCC) 12/31/2021   Situational anxiety 12/31/2021   Absolute anemia 12/31/2021   Elevated low density lipoprotein (LDL) cholesterol level 12/28/2021    Past Surgical History:  Procedure Laterality Date   CESAREAN SECTION     x 3   COLONOSCOPY WITH PROPOFOL  N/A 08/16/2019   Procedure: COLONOSCOPY WITH PROPOFOL ;  Surgeon: Jinny Carmine, MD;  Location[facility];  Service: Endoscopy;  Laterality: N/A;  priority 4   TUBAL LIGATION      OB History     Gravida  5   Para  3   Term  3   Preterm      AB  2   Living  3      SAB  2   IAB      Ectopic      Multiple      Live Births               Home Medications    Prior to Admission medications   Medication Sig Start Date End Date Taking? Authorizing Provider  naltrexone (DEPADE) 50 MG tablet Take by mouth. 09/22/23  Yes [provider]  predniSONE (STERAPRED UNI-PAK 21 TAB) 10 MG (21) TBPK tablet Take by mouth daily. Take 6 tabs by mouth daily  for 2 days, then 5  tabs for 2 days, then 4 tabs for 2 days, then 3 tabs for 2 days, 2 tabs for 2 days, then 1 tab by mouth daily for 2 days 10/11/23  Yes Jonessa Triplett E, PA-C  promethazine-dextromethorphan (PROMETHAZINE-DM) 6.25-15 MG/5ML syrup Take 5 mLs by mouth 4 (four) times daily as needed for cough. 10/11/23  Yes Ryan Ogborn E, PA-C  Pseudoeph-Doxylamine-DM-APAP (NYQUIL PO) Take by mouth.   Yes [provider]  buPROPion  (WELLBUTRIN  XL) 150 MG 24 hr tablet Take 1 tablet (150 mg total) by mouth daily. 06/08/23   Cannady, Jolene T, NP  busPIRone  (BUSPAR ) 10 MG tablet TAKE 1 TABLET BY MOUTH THREE TIMES A DAY 01/18/23   Cannady, Jolene T, NP  CALCIUM CITRATE-VITAMIN D  PO Take 2 tablets by mouth daily at 2 PM.    [provider]  citalopram  (CELEXA ) 40 MG tablet TAKE 1 TABLET BY MOUTH EVERY DAY 09/22/23   Cannady, Jolene T, NP  Coenzyme Q10 (COQ10 PO) Take by mouth daily.    [provider]  estradiol-norethindrone [facility]) 0.05-0.14 MG/DAY Place 1 patch onto the skin 2 (two) times a week. 05/23/23 05/22/24  [provider]  magnesium 30 MG tablet Take 30 mg by mouth 2 (two) times daily.    [provider]  Multiple Vitamin (MULTIVITAMIN) capsule Take 1 capsule by mouth daily.    [provider]  Omega-3 Fatty Acids (FISH OIL CONCENTRATE) 1000 MG CAPS Take 1,000 mg by mouth daily at 12 noon.    [provider]  Probiotic Product (PROBIOTIC PO) Take by mouth 3 (three) times daily.    [provider]    Family History Family History  Problem Relation Age of Onset   Hyperlipidemia Mother    Hypertension Mother    Hypercholesterolemia Mother    Heart disease Father    Hypertension Father    Mental illness Father    Diabetes Father    Depression Maternal Grandmother    Depression Maternal Grandfather    Stroke Paternal Grandmother    Breast cancer Neg Hx     Social History Social History   Tobacco Use   Smoking status: Never    Smokeless tobacco: Never  Vaping Use   Vaping status: Never Used  Substance Use Topics   Alcohol use: Yes    Alcohol/week: 3.0 standard drinks of alcohol    Types: 3 Standard drinks or equivalent per week    Comment: 3-4/week   Drug use: No     Allergies   Patient has no known allergies.   Review of Systems Review of Systems  Constitutional:  Negative for appetite change, chills and fever.  HENT:  Negative for congestion, ear pain, rhinorrhea, sinus pressure, sinus pain and sore throat.   Eyes:  Negative for redness and visual disturbance.  Respiratory:  Positive for cough. Negative for chest tightness, shortness of breath and wheezing.   Cardiovascular:  Negative for chest pain and palpitations.  Gastrointestinal:  Negative for abdominal pain, constipation, diarrhea, nausea and vomiting.  Genitourinary:  Negative for dysuria, frequency and urgency.  Musculoskeletal:  Negative for myalgias.  Neurological:  Negative for dizziness, weakness and headaches.  Psychiatric/Behavioral:  Negative for confusion.   All other systems reviewed and are negative.    Physical Exam Triage Vital Signs ED Triage Vitals  Encounter Vitals Group     BP 10/11/23 0926 111/68     Girls Systolic BP Percentile --      Girls Diastolic BP Percentile --      Boys Systolic BP Percentile --      Boys Diastolic BP Percentile --      Pulse Rate 10/11/23 0926 67     Resp 10/11/23 0926 16     Temp 10/11/23 0926 98.2 F (36.8 C)     Temp Source 10/11/23 0926 Oral     SpO2 10/11/23 0926 98 %     Weight --      Height --      Head Circumference --      Peak Flow --      Pain Score 10/11/23 0927 0     Pain Loc --      Pain Education --      Exclude from Growth Chart --    No data found.  Updated Vital Signs BP 111/68 (BP Location: Left Arm)   Pulse 67   Temp 98.2 F (36.8 C) (Oral)   Resp 16   LMP 06/05/2018 (Approximate)   SpO2 98%   Visual Acuity Right Eye Distance:   Left Eye  Distance:   Bilateral Distance:    Right Eye Near:   Left  Eye Near:    Bilateral Near:     Physical Exam Vitals reviewed.  Constitutional:      General: She is not in acute distress.    Appearance: Normal appearance. She is not ill-appearing.  HENT:     Head: Normocephalic and atraumatic.     Right Ear: Tympanic membrane, ear canal and external ear normal. No tenderness. No middle ear effusion. There is no impacted cerumen. Tympanic membrane is not perforated, erythematous, retracted or bulging.     Left Ear: Tympanic membrane, ear canal and external ear normal. No tenderness.  No middle ear effusion. There is no impacted cerumen. Tympanic membrane is not perforated, erythematous, retracted or bulging.     Nose: Nose normal. No congestion.     Mouth/Throat:     Mouth: Mucous membranes are moist.     Pharynx: Uvula midline. No oropharyngeal exudate or posterior oropharyngeal erythema.  Eyes:     Extraocular Movements: Extraocular movements intact.     Pupils: Pupils are equal, round, and reactive to light.  Cardiovascular:     Rate and Rhythm: Normal rate and regular rhythm.     Heart sounds: Normal heart sounds.  Pulmonary:     Effort: Pulmonary effort is normal.     Breath sounds: Normal breath sounds. No decreased breath sounds, wheezing, rhonchi or rales.     Comments: Frequent cough  Abdominal:     Palpations: Abdomen is soft.     Tenderness: There is no abdominal tenderness. There is no guarding or rebound.  Lymphadenopathy:     Cervical: No cervical adenopathy.     Right cervical: No superficial cervical adenopathy.    Left cervical: No superficial cervical adenopathy.  Neurological:     General: No focal deficit present.     Mental Status: She is alert and oriented to person, place, and time.  Psychiatric:        Mood and Affect: Mood normal.        Behavior: Behavior normal.        Thought Content: Thought content normal.        Judgment: Judgment normal.       UC Treatments / Results  Labs (all labs ordered are listed, but only abnormal results are displayed) Labs Reviewed - No data to display  EKG   Radiology DG Chest 2 View Result Date: 10/11/2023 CLINICAL DATA:  Cough x1 month EXAM: CHEST - 2 VIEW COMPARISON:  None Available. FINDINGS: The heart size and mediastinal contours are within normal limits. Both lungs are clear. The visualized skeletal structures are unremarkable. IMPRESSION: No active cardiopulmonary disease. Electronically Signed   By: Michaeline Blanch M.D.   On: 10/11/2023 10:38    Procedures Procedures (including critical care time)  Medications Ordered in UC Medications - No data to display  Initial Impression / Assessment and Plan / UC Course  I have reviewed the triage vital signs and the nursing notes.  Pertinent labs & imaging results that were available during my care of the patient were reviewed by me and considered in my medical decision making (see chart for details).     Acute bronchitis This patient is a pleasant 58 year old female presenting with acute bronchitis following viral illness.  CXR: No active cardiopulmonary disease. Due to duration of symptoms, we did not check a COVID or influenza test today.  Will manage with prednisone taper and Promethazine DM cough syrup.  She is not a diabetic. She does not have a history of pulmonary  disease, and is low risk for atypical pneumonia or secondary lung infection, so antibiotics were not sent today.  Return precautions: Worsening cough, cough productive of purulent sputum, new fevers, new chest pain.  Final Clinical Impressions(s) / UC Diagnoses   Final diagnoses:  Acute bronchitis, unspecified organism     Discharge Instructions      -Your chest x-ray looks normal. We will call later today if radiology finds any abnormalities -Prednisone taper: taker earlier in the day as this can cause energy -Promethazine DM cough syrup for  congestion/cough. This could make you drowsy, so take at night before bed. -Seek additional care if symptoms worsen instead of improve      ED Prescriptions     Medication Sig Dispense Auth. Provider   predniSONE (STERAPRED UNI-PAK 21 TAB) 10 MG (21) TBPK tablet Take by mouth daily. Take 6 tabs by mouth daily  for 2 days, then 5 tabs for 2 days, then 4 tabs for 2 days, then 3 tabs for 2 days, 2 tabs for 2 days, then 1 tab by mouth daily for 2 days 42 tablet Adrion Menz E, PA-C   promethazine-dextromethorphan (PROMETHAZINE-DM) 6.25-15 MG/5ML syrup Take 5 mLs by mouth 4 (four) times daily as needed for cough. 118 mL Kairon Shock E, PA-C      PDMP not reviewed this encounter.   Arlyss Leita BRAVO, PA-C 10/11/23 1044

## 2023-10-11 NOTE — ED Triage Notes (Signed)
 Pt reports  cough x 1 month. Cough is worse at night. Delsym, Nyquil gives no relief.

## 2023-10-11 NOTE — Discharge Instructions (Addendum)
-  Your chest x-ray looks normal. We will call later today if radiology finds any abnormalities -Prednisone taper: taker earlier in the day as this can cause energy -Promethazine DM cough syrup for congestion/cough. This could make you drowsy, so take at night before bed. -Seek additional care if symptoms worsen instead of improve

## 2023-11-30 ENCOUNTER — Other Ambulatory Visit: Payer: Self-pay | Admitting: Nurse Practitioner

## 2023-12-02 NOTE — Telephone Encounter (Signed)
 Requested Prescriptions  Pending Prescriptions Disp Refills   buPROPion  (WELLBUTRIN  XL) 150 MG 24 hr tablet [Pharmacy Med Name: BUPROPION  HCL XL 150 MG TABLET] 90 tablet 0    Sig: TAKE 1 TABLET BY MOUTH EVERY DAY     Psychiatry: Antidepressants - bupropion  Failed - 12/02/2023  4:09 PM      Failed - Cr in normal range and within 360 days    Creatinine, Ser  Date Value Ref Range Status  11/30/2022 0.97 0.57 - 1.00 mg/dL Final         Failed - AST in normal range and within 360 days    AST  Date Value Ref Range Status  11/30/2022 27 0 - 40 IU/L Final         Failed - ALT in normal range and within 360 days    ALT  Date Value Ref Range Status  11/30/2022 13 0 - 32 IU/L Final         Passed - Completed PHQ-2 or PHQ-9 in the last 360 days      Passed - Last BP in normal range    BP Readings from Last 1 Encounters:  10/11/23 111/68         Passed - Valid encounter within last 6 months    Recent Outpatient Visits           4 months ago Depression, major, single episode, moderate (HCC)   [facility] [place], [place] T, NP   5 months ago Depression, major, single episode, moderate (HCC)   [facility] [place], Melanie DASEN, NP

## 2023-12-03 NOTE — Patient Instructions (Incomplete)
 Be Involved in Caring For Your Health:  Taking Medications When medications are taken as directed, they can greatly improve your health. But if they are not taken as prescribed, they may not work. In some cases, not taking them correctly can be harmful. To help ensure your treatment remains effective and safe, understand your medications and how to take them. Bring your medications to each visit for review by your provider.  Your lab results, notes, and after visit summary will be available on My Chart. We strongly encourage you to use this feature. If lab results are abnormal the clinic will contact you with the appropriate steps. If the clinic does not contact you assume the results are satisfactory. You can always view your results on My Chart. If you have questions regarding your health or results, please contact the clinic during office hours. You can also ask questions on My Chart.  We at [facility] are grateful that you chose us  to provide your care. We strive to provide evidence-based and compassionate care and are always looking for feedback. If you get a survey from the clinic please complete this so we can hear your opinions.  Healthy Eating, Adult Healthy eating may help you get and keep a healthy body weight, reduce the risk of chronic disease, and live a long and productive life. It is important to follow a healthy eating pattern. Your nutritional and calorie needs should be met mainly by different nutrient-rich foods. What are tips for following this plan? Reading food labels Read labels and choose the following: Reduced or low sodium products. Juices with 100% fruit juice. Foods with low saturated fats (<3 g per serving) and high polyunsaturated and monounsaturated fats. Foods with whole grains, such as whole wheat, cracked wheat, brown rice, and wild rice. Whole grains that are fortified with folic acid. This is recommended for females who are pregnant or who want to  become pregnant. Read labels and do not eat or drink the following: Foods or drinks with added sugars. These include foods that contain brown sugar, corn sweetener, corn syrup, dextrose , fructose, glucose, high-fructose corn syrup, honey, invert sugar, lactose, malt syrup, maltose, molasses, raw sugar, sucrose, trehalose, or turbinado sugar. Limit your intake of added sugars to less than 10% of your total daily calories. Do not eat more than the following amounts of added sugar per day: 6 teaspoons (25 g) for females. 9 teaspoons (38 g) for males. Foods that contain processed or refined starches and grains. Refined grain products, such as white flour, degermed cornmeal, white bread, and white rice. Shopping Choose nutrient-rich snacks, such as vegetables, whole fruits, and nuts. Avoid high-calorie and high-sugar snacks, such as potato chips, fruit snacks, and candy. Use oil-based dressings and spreads on foods instead of solid fats such as butter, margarine, sour cream, or cream cheese. Limit pre-made sauces, mixes, and instant products such as flavored rice, instant noodles, and ready-made pasta. Try more plant-protein sources, such as tofu, tempeh, black beans, edamame, lentils, nuts, and seeds. Explore eating plans such as the Mediterranean diet or vegetarian diet. Try heart-healthy dips made with beans and healthy fats like hummus and guacamole. Vegetables go great with these. Cooking Use oil to saut or stir-fry foods instead of solid fats such as butter, margarine, or lard. Try baking, boiling, grilling, or broiling instead of frying. Remove the fatty part of meats before cooking. Steam vegetables in water  or broth. Meal planning  At meals, imagine dividing your plate into fourths: One-half of  your plate is fruits and vegetables. One-fourth of your plate is whole grains. One-fourth of your plate is protein, especially lean meats, poultry, eggs, tofu, beans, or nuts. Include low-fat  dairy as part of your daily diet. Lifestyle Choose healthy options in all settings, including home, work, school, restaurants, or stores. Prepare your food safely: Wash your hands after handling raw meats. Where you prepare food, keep surfaces clean by regularly washing with hot, soapy water . Keep raw meats separate from ready-to-eat foods, such as fruits and vegetables. Cook seafood, meat, poultry, and eggs to the recommended temperature. Get a food thermometer. Store foods at safe temperatures. In general: Keep cold foods at 84F (4.4C) or below. Keep hot foods at 184F (60C) or above. Keep your freezer at [facility] (-17.8C) or below. Foods are not safe to eat if they have been between the temperatures of 40-184F (4.4-60C) for more than 2 hours. What foods should I eat? Fruits Aim to eat 1-2 cups of fresh, canned (in natural juice), or frozen fruits each day. One cup of fruit equals 1 small apple, 1 large banana, 8 large strawberries, 1 cup (237 g) canned fruit,  cup (82 g) dried fruit, or 1 cup (240 mL) 100% juice. Vegetables Aim to eat 2-4 cups of fresh and frozen vegetables each day, including different varieties and colors. One cup of vegetables equals 1 cup (91 g) broccoli or cauliflower florets, 2 medium carrots, 2 cups (150 g) raw, leafy greens, 1 large tomato, 1 large bell pepper, 1 large sweet potato, or 1 medium white potato. Grains Aim to eat 5-10 ounce-equivalents of whole grains each day. Examples of 1 ounce-equivalent of grains include 1 slice of bread, 1 cup (40 g) ready-to-eat cereal, 3 cups (24 g) popcorn, or  cup (93 g) cooked rice. Meats and other proteins Try to eat 5-7 ounce-equivalents of protein each day. Examples of 1 ounce-equivalent of protein include 1 egg,  oz nuts (12 almonds, 24 pistachios, or 7 walnut halves), 1/4 cup (90 g) cooked beans, 6 tablespoons (90 g) hummus or 1 tablespoon (16 g) peanut butter. A cut of meat or fish that is the size of a deck of  cards is about 3-4 ounce-equivalents (85 g). Of the protein you eat each week, try to have at least 8 sounce (227 g) of seafood. This is about 2 servings per week. This includes salmon, trout, herring, sardines, and anchovies. Dairy Aim to eat 3 cup-equivalents of fat-free or low-fat dairy each day. Examples of 1 cup-equivalent of dairy include 1 cup (240 mL) milk, 8 ounces (250 g) yogurt, 1 ounces (44 g) natural cheese, or 1 cup (240 mL) fortified soy milk. Fats and oils Aim for about 5 teaspoons (21 g) of fats and oils per day. Choose monounsaturated fats, such as canola and olive oils, mayonnaise made with olive oil or avocado oil, avocados, peanut butter, and most nuts, or polyunsaturated fats, such as sunflower, corn, and soybean oils, walnuts, pine nuts, sesame seeds, sunflower seeds, and flaxseed. Beverages Aim for 6 eight-ounce glasses of water  per day. Limit coffee to 3-5 eight-ounce cups per day. Limit caffeinated beverages that have added calories, such as soda and energy drinks. If you drink alcohol: Limit how much you have to: 0-1 drink a day if you are female. 0-2 drinks a day if you are female. Know how much alcohol is in your drink. In the [place] one drink is one 12 oz bottle of beer (355 mL), one 5 oz glass of wine (  148 mL), or one 1 oz glass of hard liquor (44 mL). Seasoning and other foods Try not to add too much salt to your food. Try using herbs and spices instead of salt. Try not to add sugar to food. This information is based on [place] nutrition guidelines. To learn more, visit DisposableNylon.be. Exact amounts may vary. You may need different amounts. This information is not intended to replace advice given to you by your health care provider. Make sure you discuss any questions you have with your health care provider. Document Revised: 09/21/2021 Document Reviewed: 09/21/2021 Elsevier Patient Education  2024 [employer].

## 2023-12-15 ENCOUNTER — Encounter: Admitting: Nurse Practitioner

## 2023-12-15 DIAGNOSIS — Z0000 Encounter for general adult medical examination without abnormal findings: Secondary | ICD-10-CM

## 2023-12-15 DIAGNOSIS — D508 Other iron deficiency anemias: Secondary | ICD-10-CM

## 2023-12-15 DIAGNOSIS — F321 Major depressive disorder, single episode, moderate: Secondary | ICD-10-CM

## 2023-12-15 DIAGNOSIS — F418 Other specified anxiety disorders: Secondary | ICD-10-CM

## 2023-12-15 DIAGNOSIS — Z23 Encounter for immunization: Secondary | ICD-10-CM

## 2023-12-15 DIAGNOSIS — E7800 Pure hypercholesterolemia, unspecified: Secondary | ICD-10-CM

## 2023-12-21 ENCOUNTER — Other Ambulatory Visit: Payer: Self-pay | Admitting: Obstetrics and Gynecology

## 2023-12-21 DIAGNOSIS — Z1231 Encounter for screening mammogram for malignant neoplasm of breast: Secondary | ICD-10-CM

## 2024-01-07 NOTE — Patient Instructions (Signed)
 Be Involved in Caring For Your Health:  Taking Medications When medications are taken as directed, they can greatly improve your health. But if they are not taken as prescribed, they may not work. In some cases, not taking them correctly can be harmful. To help ensure your treatment remains effective and safe, understand your medications and how to take them. Bring your medications to each visit for review by your provider.  Your lab results, notes, and after visit summary will be available on My Chart. We strongly encourage you to use this feature. If lab results are abnormal the clinic will contact you with the appropriate steps. If the clinic does not contact you assume the results are satisfactory. You can always view your results on My Chart. If you have questions regarding your health or results, please contact the clinic during office hours. You can also ask questions on My Chart.  We at [facility] are grateful that you chose us  to provide your care. We strive to provide evidence-based and compassionate care and are always looking for feedback. If you get a survey from the clinic please complete this so we can hear your opinions.  Healthy Eating, Adult Healthy eating may help you get and keep a healthy body weight, reduce the risk of chronic disease, and live a long and productive life. It is important to follow a healthy eating pattern. Your nutritional and calorie needs should be met mainly by different nutrient-rich foods. What are tips for following this plan? Reading food labels Read labels and choose the following: Reduced or low sodium products. Juices with 100% fruit juice. Foods with low saturated fats (<3 g per serving) and high polyunsaturated and monounsaturated fats. Foods with whole grains, such as whole wheat, cracked wheat, brown rice, and wild rice. Whole grains that are fortified with folic acid. This is recommended for females who are pregnant or who want to  become pregnant. Read labels and do not eat or drink the following: Foods or drinks with added sugars. These include foods that contain brown sugar, corn sweetener, corn syrup, dextrose , fructose, glucose, high-fructose corn syrup, honey, invert sugar, lactose, malt syrup, maltose, molasses, raw sugar, sucrose, trehalose, or turbinado sugar. Limit your intake of added sugars to less than 10% of your total daily calories. Do not eat more than the following amounts of added sugar per day: 6 teaspoons (25 g) for females. 9 teaspoons (38 g) for males. Foods that contain processed or refined starches and grains. Refined grain products, such as white flour, degermed cornmeal, white bread, and white rice. Shopping Choose nutrient-rich snacks, such as vegetables, whole fruits, and nuts. Avoid high-calorie and high-sugar snacks, such as potato chips, fruit snacks, and candy. Use oil-based dressings and spreads on foods instead of solid fats such as butter, margarine, sour cream, or cream cheese. Limit pre-made sauces, mixes, and instant products such as flavored rice, instant noodles, and ready-made pasta. Try more plant-protein sources, such as tofu, tempeh, black beans, edamame, lentils, nuts, and seeds. Explore eating plans such as the Mediterranean diet or vegetarian diet. Try heart-healthy dips made with beans and healthy fats like hummus and guacamole. Vegetables go great with these. Cooking Use oil to saut or stir-fry foods instead of solid fats such as butter, margarine, or lard. Try baking, boiling, grilling, or broiling instead of frying. Remove the fatty part of meats before cooking. Steam vegetables in water  or broth. Meal planning  At meals, imagine dividing your plate into fourths: One-half of  your plate is fruits and vegetables. One-fourth of your plate is whole grains. One-fourth of your plate is protein, especially lean meats, poultry, eggs, tofu, beans, or nuts. Include low-fat  dairy as part of your daily diet. Lifestyle Choose healthy options in all settings, including home, work, school, restaurants, or stores. Prepare your food safely: Wash your hands after handling raw meats. Where you prepare food, keep surfaces clean by regularly washing with hot, soapy water . Keep raw meats separate from ready-to-eat foods, such as fruits and vegetables. Cook seafood, meat, poultry, and eggs to the recommended temperature. Get a food thermometer. Store foods at safe temperatures. In general: Keep cold foods at 84F (4.4C) or below. Keep hot foods at 184F (60C) or above. Keep your freezer at [facility] (-17.8C) or below. Foods are not safe to eat if they have been between the temperatures of 40-184F (4.4-60C) for more than 2 hours. What foods should I eat? Fruits Aim to eat 1-2 cups of fresh, canned (in natural juice), or frozen fruits each day. One cup of fruit equals 1 small apple, 1 large banana, 8 large strawberries, 1 cup (237 g) canned fruit,  cup (82 g) dried fruit, or 1 cup (240 mL) 100% juice. Vegetables Aim to eat 2-4 cups of fresh and frozen vegetables each day, including different varieties and colors. One cup of vegetables equals 1 cup (91 g) broccoli or cauliflower florets, 2 medium carrots, 2 cups (150 g) raw, leafy greens, 1 large tomato, 1 large bell pepper, 1 large sweet potato, or 1 medium white potato. Grains Aim to eat 5-10 ounce-equivalents of whole grains each day. Examples of 1 ounce-equivalent of grains include 1 slice of bread, 1 cup (40 g) ready-to-eat cereal, 3 cups (24 g) popcorn, or  cup (93 g) cooked rice. Meats and other proteins Try to eat 5-7 ounce-equivalents of protein each day. Examples of 1 ounce-equivalent of protein include 1 egg,  oz nuts (12 almonds, 24 pistachios, or 7 walnut halves), 1/4 cup (90 g) cooked beans, 6 tablespoons (90 g) hummus or 1 tablespoon (16 g) peanut butter. A cut of meat or fish that is the size of a deck of  cards is about 3-4 ounce-equivalents (85 g). Of the protein you eat each week, try to have at least 8 sounce (227 g) of seafood. This is about 2 servings per week. This includes salmon, trout, herring, sardines, and anchovies. Dairy Aim to eat 3 cup-equivalents of fat-free or low-fat dairy each day. Examples of 1 cup-equivalent of dairy include 1 cup (240 mL) milk, 8 ounces (250 g) yogurt, 1 ounces (44 g) natural cheese, or 1 cup (240 mL) fortified soy milk. Fats and oils Aim for about 5 teaspoons (21 g) of fats and oils per day. Choose monounsaturated fats, such as canola and olive oils, mayonnaise made with olive oil or avocado oil, avocados, peanut butter, and most nuts, or polyunsaturated fats, such as sunflower, corn, and soybean oils, walnuts, pine nuts, sesame seeds, sunflower seeds, and flaxseed. Beverages Aim for 6 eight-ounce glasses of water  per day. Limit coffee to 3-5 eight-ounce cups per day. Limit caffeinated beverages that have added calories, such as soda and energy drinks. If you drink alcohol: Limit how much you have to: 0-1 drink a day if you are female. 0-2 drinks a day if you are female. Know how much alcohol is in your drink. In the [place] one drink is one 12 oz bottle of beer (355 mL), one 5 oz glass of wine (  148 mL), or one 1 oz glass of hard liquor (44 mL). Seasoning and other foods Try not to add too much salt to your food. Try using herbs and spices instead of salt. Try not to add sugar to food. This information is based on [place] nutrition guidelines. To learn more, visit [employer]. Exact amounts may vary. You may need different amounts. This information is not intended to replace advice given to you by your health care provider. Make sure you discuss any questions you have with your health care provider. Document Revised: 09/21/2021 Document Reviewed: 09/21/2021 Elsevier Patient Education  2024 [employer].

## 2024-01-11 ENCOUNTER — Encounter: Payer: Self-pay | Admitting: Nurse Practitioner

## 2024-01-11 ENCOUNTER — Ambulatory Visit (INDEPENDENT_AMBULATORY_CARE_PROVIDER_SITE_OTHER): Payer: Self-pay | Admitting: Nurse Practitioner

## 2024-01-11 VITALS — BP 89/59 | HR 98 | Temp 98.1°F | Resp 16 | Ht 64.02 in | Wt 107.4 lb

## 2024-01-11 DIAGNOSIS — F321 Major depressive disorder, single episode, moderate: Secondary | ICD-10-CM

## 2024-01-11 DIAGNOSIS — F418 Other specified anxiety disorders: Secondary | ICD-10-CM

## 2024-01-11 DIAGNOSIS — Z23 Encounter for immunization: Secondary | ICD-10-CM

## 2024-01-11 DIAGNOSIS — Z0000 Encounter for general adult medical examination without abnormal findings: Secondary | ICD-10-CM

## 2024-01-11 DIAGNOSIS — D509 Iron deficiency anemia, unspecified: Secondary | ICD-10-CM

## 2024-01-11 DIAGNOSIS — E7800 Pure hypercholesterolemia, unspecified: Secondary | ICD-10-CM

## 2024-01-11 MED ORDER — BUSPIRONE HCL 10 MG PO TABS: 10.0000 mg | ORAL_TABLET | Freq: Three times a day (TID) | ORAL | 2 refills | Status: AC

## 2024-01-11 MED ORDER — BUPROPION HCL ER (XL) 300 MG PO TB24: 300.0000 mg | ORAL_TABLET | Freq: Every day | ORAL | 1 refills | Status: AC

## 2024-01-11 MED ORDER — CITALOPRAM HYDROBROMIDE 40 MG PO TABS: 40.0000 mg | ORAL_TABLET | Freq: Every day | ORAL | 3 refills | Status: AC

## 2024-01-11 NOTE — Progress Notes (Signed)
 "  BP (!) 89/59 (BP Location: Right Arm, Patient Position: Sitting, Cuff Size: Normal)   Pulse 98   Temp 98.1 F (36.7 C) (Oral)   Resp 16   Ht 5' 4.02 (1.626 m)   Wt 107 lb 6.4 oz (48.7 kg)   LMP 06/05/2018   BMI 18.43 kg/m    Subjective:    Patient ID: Carmen George, female    DOB: 24-Sep-1965, 59 y.o.   MRN: [id number]  HPI: Carmen George is a 59 y.o. female presenting on 01-29-24 for comprehensive medical examination. Current medical complaints include:none  She currently lives with: husband Menopausal Symptoms: no  ANXIETY/STRESS Takes Wellbutrin  XL 150 MG daily, Celexa  40 MG daily, and Buspar  10 MG TID. Remains without job which is stressor and worry about his middle child. Not currently attending therapy. History of anemia, takes supplements daily.  Follows with Dr. Leonce for GYN, continues Combipatch. Duration:stable Anxious mood: yes  Excessive worrying: yes Irritability: yes  Sweating: no Nausea: no Palpitations:no Hyperventilation: no Panic attacks: no Agoraphobia: no  Obscessions/compulsions: no Depressed mood: yes    Jan 29, 2024    1:54 PM January 29, 2024    1:36 PM 07/07/2023   10:39 AM 06/08/2023    9:05 AM 11/30/2022    2:00 PM  Depression screen PHQ 2/9  Decreased Interest 2 2 1 2 1   Down, Depressed, Hopeless 3 3 2 2 1   PHQ - 2 Score 5 5 3 4 2   Altered sleeping 2  2 3 2   Tired, decreased energy 1 2 1 3 1   Change in appetite 1 1 1 3 1   Feeling bad or failure about yourself  1 1 1 1 1   Trouble concentrating 2 2 1 2 2   Moving slowly or fidgety/restless 0 0 0 0 0  Suicidal thoughts 0 0 0 0 0  PHQ-9 Score 12  9  16  9    Difficult doing work/chores Somewhat difficult Very difficult Somewhat difficult Very difficult Somewhat difficult     Data saved with a previous flowsheet row definition  Anhedonia:yes Weight changes: no Insomnia: sleeping too much Hypersomnia: no Fatigue/loss of energy: yes Feelings of worthlessness: no Feelings of guilt:  no Impaired concentration/indecisiveness: yes Suicidal ideations: no  Crying spells: not too bad Recent Stressors/Life Changes: yes   Relationship problems: no   Family stress: yes     Financial stress: no    Job stress: yes   Recent death/loss: no     01-29-24    1:36 PM 07/07/2023   10:41 AM 06/08/2023    9:07 AM 11/30/2022    2:00 PM  GAD 7 : Generalized Anxiety Score  Nervous, Anxious, on Edge 2 1 1 1   Control/stop worrying 2 1 2 2   Worry too much - different things 2 1 2 2   Trouble relaxing 1 1 0 1  Restless 1 1 0 0  Easily annoyed or irritable 3 2 2 2   Afraid - awful might happen 2 1 1 1   Total GAD 7 Score 13 8 8 9   Anxiety Difficulty Somewhat difficult Somewhat difficult Somewhat difficult Somewhat difficult      07/06/2022    1:36 PM 11/30/2022    1:59 PM 06/08/2023    9:04 AM 07/07/2023   10:39 AM January 29, 2024    1:35 PM  Fall Risk  Falls in the past year? 1 1 0 0 0  Was there an injury with Fall?  1  0  0  0  Fall Risk  Category Calculator  2 0 0 0  Patient at Risk for Falls Due to Impaired balance/gait;History of fall(s);Orthopedic patient History of fall(s) No Fall Risks  No Fall Risks  Fall risk Follow up Falls evaluation completed Education provided Falls evaluation completed Falls evaluation completed Falls evaluation completed     Data saved with a previous flowsheet row definition    Past Medical History:  Past Medical History:  Diagnosis Date   Chicken pox    Depression    Situational anxiety 12/31/2021   Wears contact lenses     Surgical History:  Past Surgical History:  Procedure Laterality Date   CESAREAN SECTION     x 3   COLONOSCOPY WITH PROPOFOL  N/A 08/16/2019   Procedure: COLONOSCOPY WITH PROPOFOL ;  Surgeon: Jinny Carmine, MD;  Location: [facility];  Service: Endoscopy;  Laterality: N/A;  priority 4   COSMETIC SURGERY  June 2023   TUBAL LIGATION      Medications:  Current Outpatient Medications on File Prior to Visit  Medication Sig    CALCIUM CITRATE-VITAMIN D  PO Take 2 tablets by mouth daily at 2 PM.   Coenzyme Q10 (COQ10 PO) Take by mouth daily.   estradiol-norethindrone (COMBIPATCH) 0.05-0.14 MG/DAY Place 1 patch onto the skin 2 (two) times a week.   magnesium 30 MG tablet Take 30 mg by mouth 2 (two) times daily.   Multiple Vitamin (MULTIVITAMIN) capsule Take 1 capsule by mouth daily.   naltrexone (DEPADE) 50 MG tablet Take by mouth.   Omega-3 Fatty Acids (FISH OIL CONCENTRATE) 1000 MG CAPS Take 1,000 mg by mouth daily at 12 noon.   Probiotic Product (PROBIOTIC PO) Take by mouth 3 (three) times daily.   No current facility-administered medications on file prior to visit.    Allergies:  No Known Allergies  Social History:  Social History   Socioeconomic History   Marital status: Married    Spouse name: Not on file   Number of children: Not on file   Years of education: Not on file   Highest education level: Professional school degree (e.g., MD, DDS, DVM, JD)  Occupational History   Not on file  Tobacco Use   Smoking status: Never   Smokeless tobacco: Never  Vaping Use   Vaping status: Never Used  Substance and Sexual Activity   Alcohol use: Yes    Alcohol/week: 3.0 standard drinks of alcohol    Types: 3 Standard drinks or equivalent per week    Comment: 3-4/week   Drug use: No   Sexual activity: Yes    Partners: Male  Other Topics Concern   Not on file  Social History Narrative   Not on file   Social Drivers of Health   Tobacco Use: Low Risk (01/11/2024)   Patient History    Smoking Tobacco Use: Never    Smokeless Tobacco Use: Never    Passive Exposure: Not on file  Financial Resource Strain: Low Risk  (12/19/2023)   Received from [facility]   Overall Financial Resource Strain (CARDIA)    Difficulty of Paying Living Expenses: Not hard at all  Food Insecurity: No Food Insecurity (12/19/2023)   Received from [facility]   Epic    Within the past 12  months, you worried that your food would run out before you got the money to buy more.: Never true    Within the past 12 months, the food you bought just didn't last and you didn't have money to  get more.: Never true  Transportation Needs: No Transportation Needs (12/19/2023)   Received from [facility] - Transportation    In the past 12 months, has lack of transportation kept you from medical appointments or from getting medications?: No    Lack of Transportation (Non-Medical): No  Physical Activity: Sufficiently Active (07/07/2023)   Exercise Vital Sign    Days of Exercise per Week: 4 days    Minutes of Exercise per Session: 60 min  Stress: Stress Concern Present (07/07/2023)   [employer] - Occupational Stress Questionnaire    Feeling of Stress: Rather much  Social Connections: Moderately Integrated (07/07/2023)   Social Connection and Isolation Panel    Frequency of Communication with Friends and Family: Once a week    Frequency of Social Gatherings with Friends and Family: Once a week    Attends Religious Services: More than 4 times per year    Active Member of [employer] or Organizations: Yes    Attends Banker Meetings: More than 4 times per year    Marital Status: Married  Catering Manager Violence: Not At Risk (12/31/2021)   Humiliation, Afraid, Rape, and Kick questionnaire    Fear of Current or Ex-Partner: No    Emotionally Abused: No    Physically Abused: No    Sexually Abused: No  Depression (PHQ2-9): High Risk (01/11/2024)   Depression (PHQ2-9)    PHQ-2 Score: 12  Alcohol Screen: Low Risk (07/07/2023)   Alcohol Screen    Last Alcohol Screening Score (AUDIT): 4  Housing: Low Risk  (12/19/2023)   Received from [facility]   Epic    In the last 12 months, was there a time when you were not able to pay the mortgage or rent on time?: No    In the past 12 months, how many times have you moved where  you were living?: 0    At any time in the past 12 months, were you homeless or living in a shelter (including now)?: No  Utilities: Not At Risk (12/19/2023)   Received from [facility]   Epic    In the past 12 months has the electric, gas, oil, or water  company threatened to shut off services in your home?: No  Health Literacy: Not on file   Social History   Tobacco Use  Smoking Status Never  Smokeless Tobacco Never   Social History   Substance and Sexual Activity  Alcohol Use Yes   Alcohol/week: 3.0 standard drinks of alcohol   Types: 3 Standard drinks or equivalent per week   Comment: 3-4/week    Family History:  Family History  Problem Relation Age of Onset   Hyperlipidemia Mother    Hypertension Mother    Hypercholesterolemia Mother    Heart disease Father    Hypertension Father    Mental illness Father    Diabetes Father    Depression Maternal Grandmother    Depression Maternal Grandfather    Stroke Paternal Grandmother    Breast cancer Neg Hx     Past medical history, surgical history, medications, allergies, family history and social history reviewed with patient today and changes made to appropriate areas of the chart.   ROS All other ROS negative except what is listed above and in the HPI.      Objective:    BP (!) 89/59 (BP Location: Right Arm, Patient Position: Sitting, Cuff Size: Normal)   Pulse  98   Temp 98.1 F (36.7 C) (Oral)   Resp 16   Ht 5' 4.02 (1.626 m)   Wt 107 lb 6.4 oz (48.7 kg)   LMP 06/05/2018   BMI 18.43 kg/m   Wt Readings from Last 3 Encounters:  01/11/24 107 lb 6.4 oz (48.7 kg)  06/08/23 113 lb 3.2 oz (51.3 kg)  11/30/22 122 lb (55.3 kg)    Physical Exam Vitals and nursing note reviewed. Exam conducted with a chaperone present.  Constitutional:      General: She is awake. She is not in acute distress.    Appearance: She is well-developed and well-groomed. She is not ill-appearing or toxic-appearing.   HENT:     Head: Normocephalic and atraumatic.     Right Ear: Hearing, tympanic membrane, ear canal and external ear normal. No drainage.     Left Ear: Hearing, tympanic membrane, ear canal and external ear normal. No drainage.     Nose: Nose normal.     Right Sinus: No maxillary sinus tenderness or frontal sinus tenderness.     Left Sinus: No maxillary sinus tenderness or frontal sinus tenderness.     Mouth/Throat:     Mouth: Mucous membranes are moist.     Pharynx: Oropharynx is clear. Uvula midline. No pharyngeal swelling, oropharyngeal exudate or posterior oropharyngeal erythema.  Eyes:     General: Lids are normal.        Right eye: No discharge.        Left eye: No discharge.     Extraocular Movements: Extraocular movements intact.     Conjunctiva/sclera: Conjunctivae normal.     Pupils: Pupils are equal, round, and reactive to light.     Visual Fields: Right eye visual fields normal and left eye visual fields normal.  Neck:     Thyroid : No thyromegaly.     Vascular: No carotid bruit.     Trachea: Trachea normal.  Cardiovascular:     Rate and Rhythm: Normal rate and regular rhythm.     Heart sounds: Normal heart sounds. No murmur heard.    No gallop.  Pulmonary:     Effort: Pulmonary effort is normal. No accessory muscle usage or respiratory distress.     Breath sounds: Normal breath sounds.  Chest:  Breasts:    Right: Normal.     Left: Normal.     Comments: Dense breast tissue bilaterally. Abdominal:     General: Bowel sounds are normal.     Palpations: Abdomen is soft. There is no hepatomegaly or splenomegaly.     Tenderness: There is no abdominal tenderness.  Musculoskeletal:        General: Normal range of motion.     Cervical back: Normal range of motion and neck supple.     Right lower leg: No edema.     Left lower leg: No edema.  Lymphadenopathy:     Head:     Right side of head: No submental, submandibular, tonsillar, preauricular or posterior auricular  adenopathy.     Left side of head: No submental, submandibular, tonsillar, preauricular or posterior auricular adenopathy.     Cervical: No cervical adenopathy.     Upper Body:     Right upper body: No supraclavicular, axillary or pectoral adenopathy.     Left upper body: No supraclavicular, axillary or pectoral adenopathy.  Skin:    General: Skin is warm and dry.     Capillary Refill: Capillary refill takes less than 2 seconds.  Findings: No rash.  Neurological:     Mental Status: She is alert and oriented to person, place, and time.     Gait: Gait is intact.     Deep Tendon Reflexes: Reflexes are normal and symmetric.     Reflex Scores:      Brachioradialis reflexes are 2+ on the right side and 2+ on the left side.      Patellar reflexes are 2+ on the right side and 2+ on the left side. Psychiatric:        Attention and Perception: Attention normal.        Mood and Affect: Mood normal.        Speech: Speech normal.        Behavior: Behavior normal. Behavior is cooperative.        Thought Content: Thought content normal.        Judgment: Judgment normal.    Results for orders placed or performed in visit on 01/17/23  CBC with Differential/Platelet   Collection Time: 01/17/23 10:17 AM  Result Value Ref Range   WBC 4.5 3.4 - 10.8 x10E3/uL   RBC 4.17 3.77 - 5.28 x10E6/uL   Hemoglobin 12.2 11.1 - 15.9 g/dL   Hematocrit 61.2 65.9 - 46.6 %   MCV 93 79 - 97 fL   MCH 29.3 26.6 - 33.0 pg   MCHC 31.5 31.5 - 35.7 g/dL   RDW 84.8 88.2 - 84.5 %   Platelets 250 150 - 450 x10E3/uL   Neutrophils 52 Not Estab. %   Lymphs 39 Not Estab. %   Monocytes 7 Not Estab. %   Eos 1 Not Estab. %   Basos 1 Not Estab. %   Neutrophils Absolute 2.4 1.4 - 7.0 x10E3/uL   Lymphocytes Absolute 1.8 0.7 - 3.1 x10E3/uL   Monocytes Absolute 0.3 0.1 - 0.9 x10E3/uL   EOS (ABSOLUTE) 0.1 0.0 - 0.4 x10E3/uL   Basophils Absolute 0.1 0.0 - 0.2 x10E3/uL   Immature Granulocytes 0 Not Estab. %   Immature Grans  (Abs) 0.0 0.0 - 0.1 x10E3/uL  Ferritin   Collection Time: 01/17/23 10:17 AM  Result Value Ref Range   Ferritin 32 15 - 150 ng/mL  Iron   Collection Time: 01/17/23 10:17 AM  Result Value Ref Range   Iron 46 27 - 159 ug/dL      Assessment & Plan:   Problem List Items Addressed This Visit       Other   Situational anxiety   Refer to depression plan of care.      Relevant Medications   buPROPion  (WELLBUTRIN  XL) 300 MG 24 hr tablet   busPIRone  (BUSPAR ) 10 MG tablet   citalopram  (CELEXA ) 40 MG tablet   Elevated low density lipoprotein (LDL) cholesterol level   Noted on past labs, recheck today. Continue focus on healthy diet and regular exercise.      Relevant Orders   Comprehensive metabolic panel with GFR   Lipid Panel w/o Chol/HDL Ratio   Depression, major, single episode, moderate (HCC) - Primary   Chronic, exacerbated with increased anhedonia.  Denies SI/HI.  PHQ and GAD scores have trended up.  Will increase Wellbutrin  XL to 300 MG daily and continue Celexa  40 MG and Buspar  10 MG TID PRN.  Educated her on regimen and side effects to monitor for.  Discussed that Wellbutrin  works differently than Celexa  and does not come with weight gain or decline in sexual drive, that at times it can help if loss of sexual  drive with SSRI. BLACK BOX warning discussed. Recommend she return to therapy.      Relevant Medications   buPROPion  (WELLBUTRIN  XL) 300 MG 24 hr tablet   busPIRone  (BUSPAR ) 10 MG tablet   citalopram  (CELEXA ) 40 MG tablet   Other Relevant Orders   TSH   Absolute anemia   History of while donating blood, check CBC, iron, ferritin, and B12 today.  Continue supplement and adjust as needed.      Relevant Orders   Ferritin   Iron   CBC with Differential/Platelet   Vitamin B12   Other Visit Diagnoses       Flu vaccine need       Flu vaccine today, educated patient.   Relevant Orders   Flu vaccine trivalent PF, 6mos and older(Flulaval,Afluria,Fluarix,Fluzone)  (Completed)     Pneumococcal vaccination given       PCV20 today, educated patient.   Relevant Orders   Pneumococcal conjugate vaccine 20-valent (Completed)     Encounter for annual physical exam       Annual physical today with labs and health maintenance reviewed, discussed with patient.        Follow up plan: Return in about 4 weeks (around 02/08/2024) for ANXIETY, Depression -- may be virtual, increased Wellbutrin  to 300.   LABORATORY TESTING:  - Pap smear: up to date  IMMUNIZATIONS:   - Tdap: Tetanus vaccination status reviewed: last tetanus booster within 10 years. - Influenza: Obtained today - Pneumovax: Not applicable - Prevnar: PCV20 obtained today - COVID: Up to date -- first 2 and booster - HPV: Not applicable - Shingrix vaccine: Up to date  SCREENING: -Mammogram: Up to date  - Colonoscopy: Up to date  - Bone Density: Not applicable  -Hearing Test: Not applicable  -Spirometry: Not applicable   PATIENT COUNSELING:   Advised to take 1 mg of folate supplement per day if capable of pregnancy.   Sexuality: Discussed sexually transmitted diseases, partner selection, use of condoms, avoidance of unintended pregnancy  and contraceptive alternatives.   Advised to avoid cigarette smoking.  I discussed with the patient that most people either abstain from alcohol or drink within safe limits (<=14/week and <=4 drinks/occasion for males, <=7/weeks and <= 3 drinks/occasion for females) and that the risk for alcohol disorders and other health effects rises proportionally with the number of drinks per week and how often a drinker exceeds daily limits.  Discussed cessation/primary prevention of drug use and availability of treatment for abuse.   Diet: Encouraged to adjust caloric intake to maintain  or achieve ideal body weight, to reduce intake of dietary saturated fat and total fat, to limit sodium intake by avoiding high sodium foods and not adding table salt, and to maintain  adequate dietary potassium and calcium preferably from fresh fruits, vegetables, and low-fat dairy products.    Stressed the importance of regular exercise  Injury prevention: Discussed safety belts, safety helmets, smoke detector, smoking near bedding or upholstery.   Dental health: Discussed importance of regular tooth brushing, flossing, and dental visits.    NEXT PREVENTATIVE PHYSICAL DUE IN 1 YEAR. Return in about 4 weeks (around 02/08/2024) for ANXIETY, Depression -- may be virtual, increased Wellbutrin  to 300.           "

## 2024-01-11 NOTE — Assessment & Plan Note (Signed)
 History of while donating blood, check CBC, iron, ferritin, and B12 today.  Continue supplement and adjust as needed.

## 2024-01-11 NOTE — Assessment & Plan Note (Signed)
 Noted on past labs, recheck today. Continue focus on healthy diet and regular exercise.

## 2024-01-11 NOTE — Assessment & Plan Note (Addendum)
 Chronic, exacerbated with increased anhedonia.  Denies SI/HI.  PHQ and GAD scores have trended up.  Will increase Wellbutrin  XL to 300 MG daily and continue Celexa  40 MG and Buspar  10 MG TID PRN.  Educated her on regimen and side effects to monitor for.  Discussed that Wellbutrin  works differently than Celexa  and does not come with weight gain or decline in sexual drive, that at times it can help if loss of sexual drive with SSRI. BLACK BOX warning discussed. Recommend she return to therapy.

## 2024-01-11 NOTE — Assessment & Plan Note (Signed)
 Refer to depression plan of care.

## 2024-01-12 ENCOUNTER — Ambulatory Visit: Payer: Self-pay | Admitting: Nurse Practitioner

## 2024-01-12 DIAGNOSIS — D508 Other iron deficiency anemias: Secondary | ICD-10-CM

## 2024-01-12 LAB — CBC WITH DIFFERENTIAL/PLATELET
Basophils Absolute: 0 x10E3/uL (ref 0.0–0.2)
Basos: 1 %
EOS (ABSOLUTE): 0.1 x10E3/uL (ref 0.0–0.4)
Eos: 1 %
Hematocrit: 37.3 % (ref 34.0–46.6)
Hemoglobin: 11.7 g/dL (ref 11.1–15.9)
Immature Grans (Abs): 0 x10E3/uL (ref 0.0–0.1)
Immature Granulocytes: 0 %
Lymphocytes Absolute: 1.6 x10E3/uL (ref 0.7–3.1)
Lymphs: 27 %
MCH: 28.4 pg (ref 26.6–33.0)
MCHC: 31.4 g/dL — ABNORMAL LOW (ref 31.5–35.7)
MCV: 91 fL (ref 79–97)
Monocytes Absolute: 0.4 x10E3/uL (ref 0.1–0.9)
Monocytes: 7 %
Neutrophils Absolute: 3.9 x10E3/uL (ref 1.4–7.0)
Neutrophils: 64 %
Platelets: 288 x10E3/uL (ref 150–450)
RBC: 4.12 x10E6/uL (ref 3.77–5.28)
RDW: 13.8 % (ref 11.7–15.4)
WBC: 6 x10E3/uL (ref 3.4–10.8)

## 2024-01-12 LAB — COMPREHENSIVE METABOLIC PANEL WITH GFR
ALT: 7 IU/L (ref 0–32)
AST: 17 IU/L (ref 0–40)
Albumin: 4.2 g/dL (ref 3.8–4.9)
Alkaline Phosphatase: 36 IU/L — ABNORMAL LOW (ref 49–135)
BUN/Creatinine Ratio: 11 (ref 9–23)
BUN: 11 mg/dL (ref 6–24)
Bilirubin Total: 0.3 mg/dL (ref 0.0–1.2)
CO2: 20 mmol/L (ref 20–29)
Calcium: 9.6 mg/dL (ref 8.7–10.2)
Chloride: 101 mmol/L (ref 96–106)
Creatinine, Ser: 1.01 mg/dL — ABNORMAL HIGH (ref 0.57–1.00)
Globulin, Total: 2.6 g/dL (ref 1.5–4.5)
Glucose: 79 mg/dL (ref 70–99)
Potassium: 4.1 mmol/L (ref 3.5–5.2)
Sodium: 135 mmol/L (ref 134–144)
Total Protein: 6.8 g/dL (ref 6.0–8.5)
eGFR: 65 mL/min/1.73

## 2024-01-12 LAB — LIPID PANEL W/O CHOL/HDL RATIO
Cholesterol, Total: 221 mg/dL — ABNORMAL HIGH (ref 100–199)
HDL: 85 mg/dL
LDL Chol Calc (NIH): 125 mg/dL — ABNORMAL HIGH (ref 0–99)
Triglycerides: 66 mg/dL (ref 0–149)
VLDL Cholesterol Cal: 11 mg/dL (ref 5–40)

## 2024-01-12 LAB — FERRITIN: Ferritin: 10 ng/mL — ABNORMAL LOW (ref 15–150)

## 2024-01-12 LAB — IRON: Iron: 21 ug/dL — ABNORMAL LOW (ref 27–159)

## 2024-01-12 LAB — VITAMIN B12: Vitamin B-12: 685 pg/mL (ref 232–1245)

## 2024-01-12 LAB — TSH: TSH: 1.77 u[IU]/mL (ref 0.450–4.500)

## 2024-01-12 NOTE — Progress Notes (Signed)
 Contacted via MyChart -- lab only visit in 8 weeks please   Good evening Carmen George, your labs have returned: - I am glad we checked iron and ferritin, both are low. Are you taking an iron supplement daily? Let me know. If you are not I recommend starting Vitron over the counter once a day and then recheck these levels outpatient in 8 weeks. Ensure not to take iron with other medications. Your hemoglobin and hematocrit are normal though. - Kidney function, creatinine and eGFR, remains stable, as is liver function, AST and ALT.  - Thyroid  and B12 normal. - Lipid panel continues to show elevations, but no cholesterol medication. Continue focus on healthy diet and regular exercise. Any questions? Keep being stellar!!  Thank you for allowing me to participate in your care.  I appreciate you. Kindest regards, Eyla Tallon

## 2024-01-13 NOTE — Progress Notes (Signed)
 Called patient and left a message to call back to get scheduled.

## 2024-01-16 NOTE — Progress Notes (Signed)
 Scheduled.

## 2024-02-09 ENCOUNTER — Ambulatory Visit: Payer: Self-pay | Admitting: Nurse Practitioner

## 2024-02-13 ENCOUNTER — Ambulatory Visit: Payer: Self-pay | Admitting: Nurse Practitioner

## 2024-03-12 ENCOUNTER — Other Ambulatory Visit: Payer: Self-pay

## 2024-03-26 IMAGING — MG MM DIGITAL SCREENING BILAT W/ TOMO AND CAD
8 series · 8 of 24 positions shown · non-contrast
Comparison: Prior exams.

CLINICAL DATA: Screening.

EXAM:
DIGITAL SCREENING BILATERAL MAMMOGRAM WITH TOMOSYNTHESIS AND CAD
TECHNIQUE: Bilateral screening digital craniocaudal and mediolateral oblique
mammograms were obtained. Bilateral screening digital breast
tomosynthesis was performed. The images were evaluated with
computer-aided detection.

[R CC synth-2D]
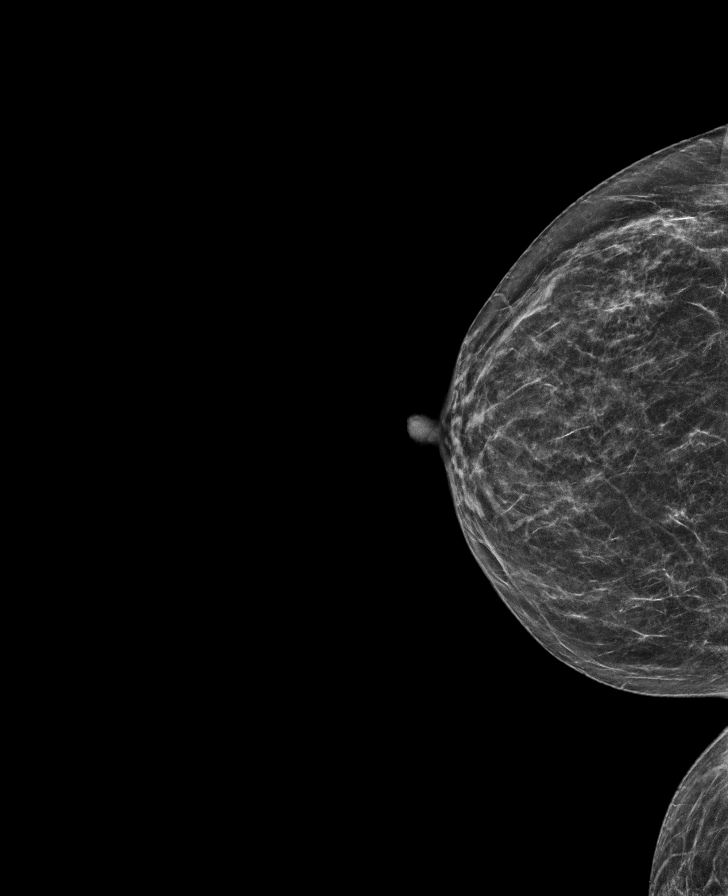

[L CC synth-2D]
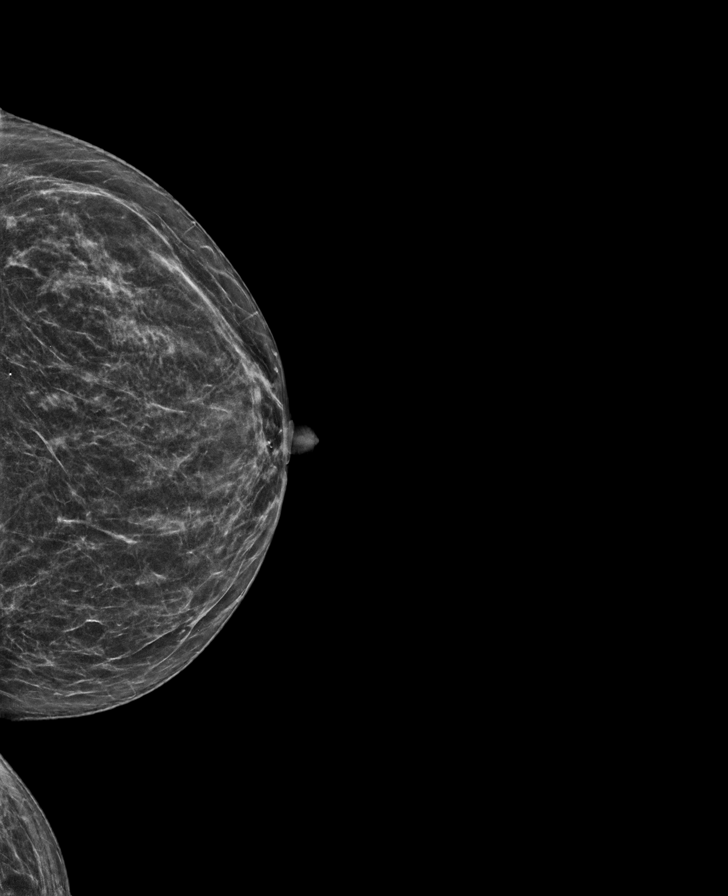

[L MLO synth-2D]
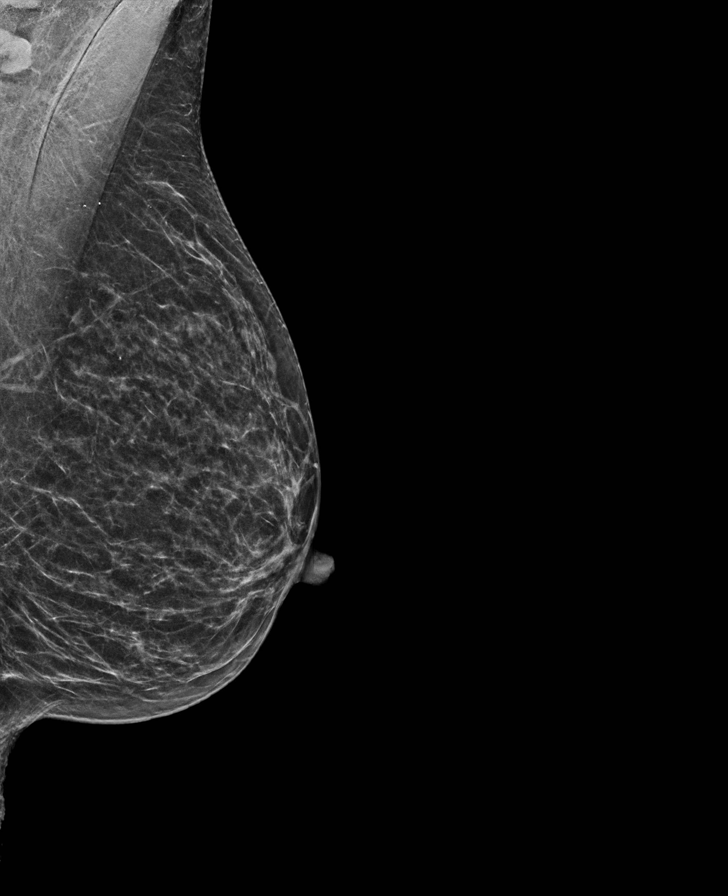

[R MLO synth-2D]
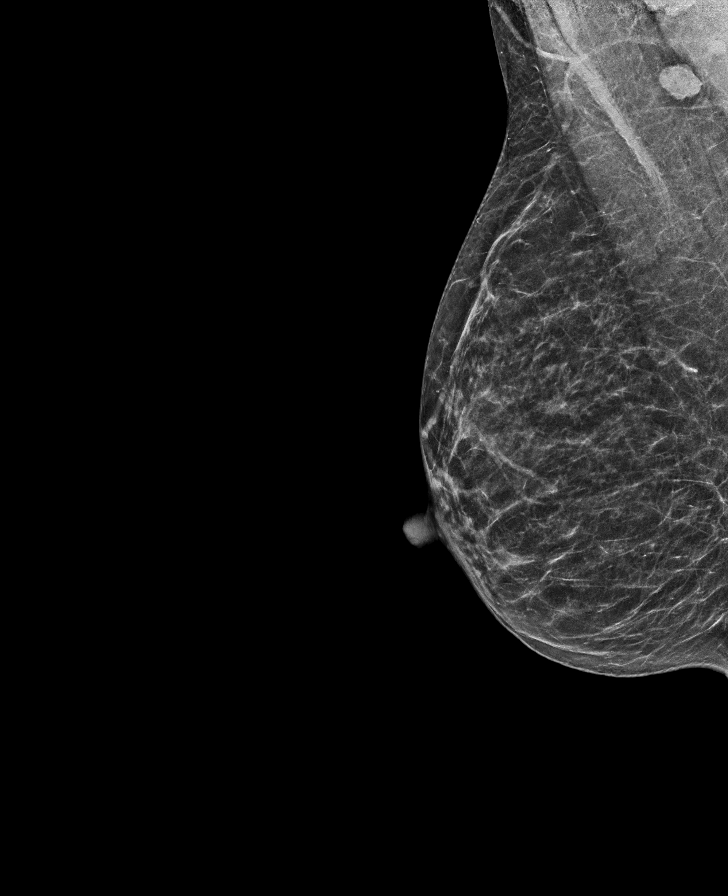

[L MLO tomo · tomo slice 30/59.0]
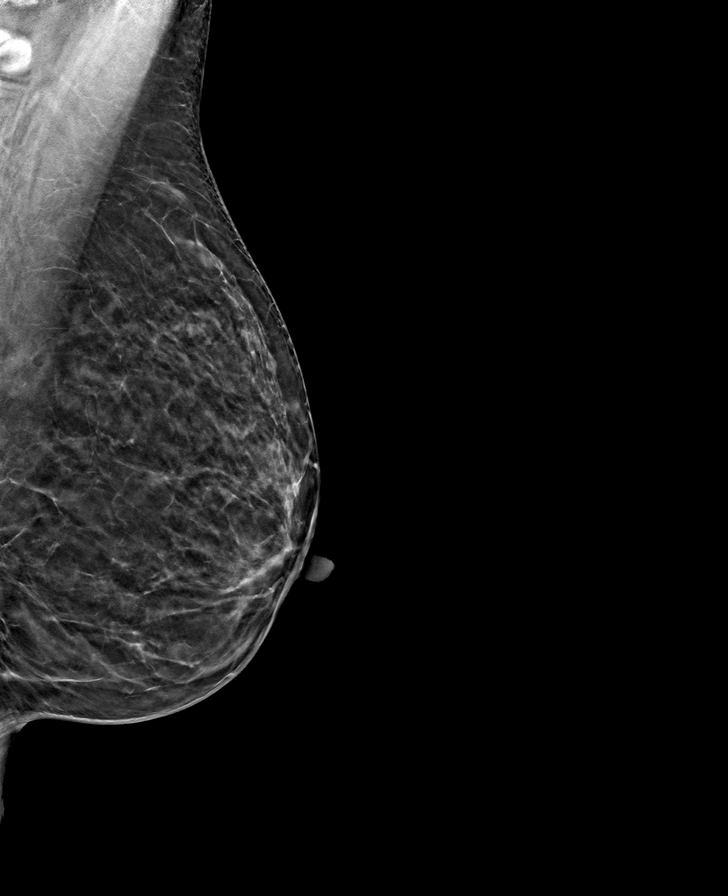

[L CC tomo · tomo slice 29/56.0]
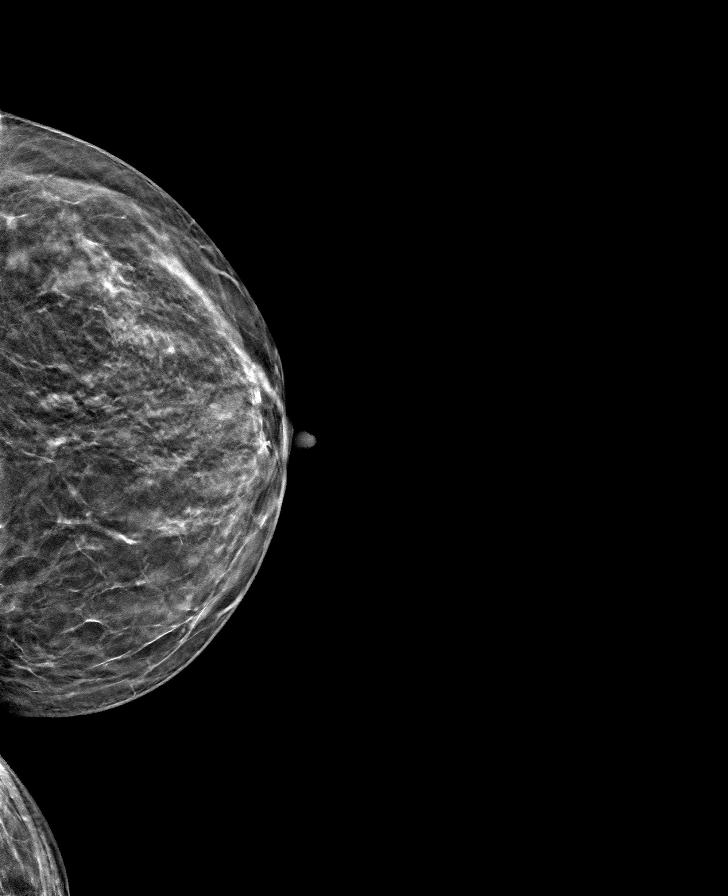

[R MLO tomo · tomo slice 29/58.0]
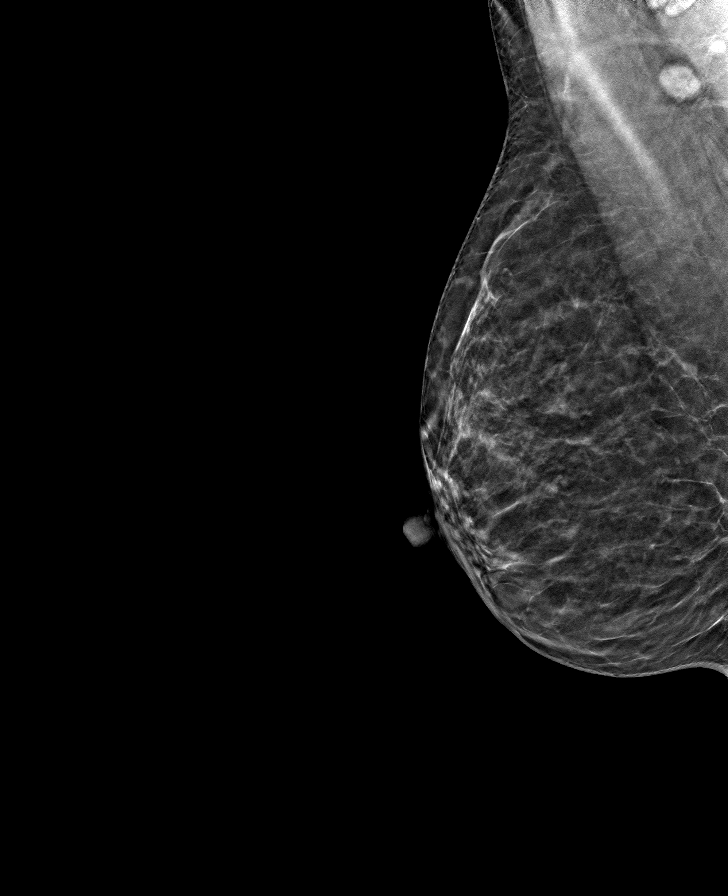

[R CC tomo · tomo slice 30/59.0]
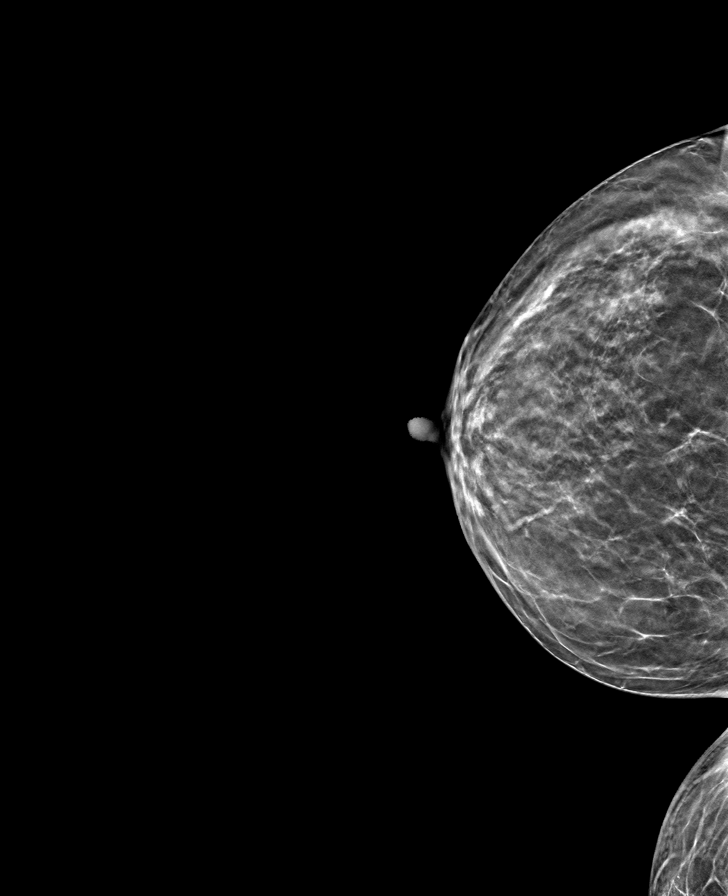

[8 of 24 positions shown; findings below may reference images not displayed]

ACR Breast Density Category b: There are scattered areas of
fibroglandular density.
FINDINGS: There are no findings suspicious for malignancy.
IMPRESSION: No mammographic evidence of malignancy. A result letter of this
screening mammogram will be mailed directly to the patient.

RECOMMENDATION:
Screening mammogram in one year. (Code:94-0-LRL)

BI-RADS CATEGORY  1: Negative.
